# Patient Record
Sex: Female | Born: 1970 | Race: Black or African American | Hispanic: No | Marital: Single | Smoking: Former smoker
Health system: Southern US, Community
[De-identification: ages and names within clinical notes are randomized; demographics above are authoritative.]

## PROBLEM LIST (undated history)

## (undated) HISTORY — PX: TUBAL LIGATION: SHX77

---

## 1997-11-06 ENCOUNTER — Other Ambulatory Visit: Admission: RE | Admit: 1997-11-06 | Discharge: 1997-11-06 | Payer: Self-pay | Admitting: *Deleted

## 1998-01-13 ENCOUNTER — Inpatient Hospital Stay (HOSPITAL_COMMUNITY): Admission: AD | Admit: 1998-01-13 | Discharge: 1998-01-13 | Payer: Self-pay | Admitting: *Deleted

## 1998-01-16 ENCOUNTER — Ambulatory Visit (HOSPITAL_COMMUNITY): Admission: RE | Admit: 1998-01-16 | Discharge: 1998-01-16 | Payer: Self-pay | Admitting: *Deleted

## 1998-02-13 ENCOUNTER — Encounter: Payer: Self-pay | Admitting: *Deleted

## 1998-02-13 ENCOUNTER — Ambulatory Visit (HOSPITAL_COMMUNITY): Admission: RE | Admit: 1998-02-13 | Discharge: 1998-02-13 | Payer: Self-pay | Admitting: *Deleted

## 1998-04-20 ENCOUNTER — Inpatient Hospital Stay (HOSPITAL_COMMUNITY): Admission: AD | Admit: 1998-04-20 | Discharge: 1998-04-20 | Payer: Self-pay | Admitting: *Deleted

## 1998-05-25 ENCOUNTER — Inpatient Hospital Stay (HOSPITAL_COMMUNITY): Admission: AD | Admit: 1998-05-25 | Discharge: 1998-05-27 | Payer: Self-pay | Admitting: Obstetrics and Gynecology

## 2000-11-24 ENCOUNTER — Other Ambulatory Visit: Admission: RE | Admit: 2000-11-24 | Discharge: 2000-11-24 | Payer: Self-pay | Admitting: Obstetrics and Gynecology

## 2004-01-16 ENCOUNTER — Emergency Department (HOSPITAL_COMMUNITY): Admission: EM | Admit: 2004-01-16 | Discharge: 2004-01-16 | Payer: Self-pay | Admitting: Emergency Medicine

## 2006-08-13 ENCOUNTER — Emergency Department (HOSPITAL_COMMUNITY): Admission: EM | Admit: 2006-08-13 | Discharge: 2006-08-13 | Payer: Self-pay | Admitting: Family Medicine

## 2008-09-04 ENCOUNTER — Inpatient Hospital Stay (HOSPITAL_COMMUNITY): Admission: AD | Admit: 2008-09-04 | Discharge: 2008-09-04 | Payer: Self-pay | Admitting: Obstetrics & Gynecology

## 2008-09-04 ENCOUNTER — Ambulatory Visit: Payer: Self-pay | Admitting: Obstetrics and Gynecology

## 2008-12-31 ENCOUNTER — Emergency Department (HOSPITAL_COMMUNITY): Admission: EM | Admit: 2008-12-31 | Discharge: 2008-12-31 | Payer: Self-pay | Admitting: Emergency Medicine

## 2009-05-04 ENCOUNTER — Emergency Department (HOSPITAL_COMMUNITY): Admission: EM | Admit: 2009-05-04 | Discharge: 2009-05-04 | Payer: Self-pay | Admitting: Family Medicine

## 2009-05-07 ENCOUNTER — Emergency Department (HOSPITAL_COMMUNITY): Admission: EM | Admit: 2009-05-07 | Discharge: 2009-05-07 | Payer: Self-pay | Admitting: Emergency Medicine

## 2010-07-25 LAB — URINE MICROSCOPIC-ADD ON

## 2010-07-25 LAB — URINALYSIS, ROUTINE W REFLEX MICROSCOPIC
Bilirubin Urine: NEGATIVE
Glucose, UA: NEGATIVE mg/dL
Ketones, ur: NEGATIVE mg/dL
Nitrite: NEGATIVE
Protein, ur: NEGATIVE mg/dL
Specific Gravity, Urine: 1.02 (ref 1.005–1.030)
Urobilinogen, UA: 1 mg/dL (ref 0.0–1.0)
pH: 6.5 (ref 5.0–8.0)

## 2010-07-29 LAB — URINALYSIS, ROUTINE W REFLEX MICROSCOPIC
Bilirubin Urine: NEGATIVE
Glucose, UA: NEGATIVE mg/dL
Hgb urine dipstick: NEGATIVE
Ketones, ur: NEGATIVE mg/dL
Nitrite: NEGATIVE
Protein, ur: NEGATIVE mg/dL
Specific Gravity, Urine: 1.025 (ref 1.005–1.030)
Urobilinogen, UA: 0.2 mg/dL (ref 0.0–1.0)
pH: 6.5 (ref 5.0–8.0)

## 2010-07-29 LAB — GC/CHLAMYDIA PROBE AMP, GENITAL
Chlamydia, DNA Probe: NEGATIVE
GC Probe Amp, Genital: NEGATIVE

## 2010-07-29 LAB — WET PREP, GENITAL: Trich, Wet Prep: NONE SEEN

## 2010-10-21 ENCOUNTER — Emergency Department (HOSPITAL_COMMUNITY)
Admission: EM | Admit: 2010-10-21 | Discharge: 2010-10-21 | Disposition: A | Discharge status: Home / Self Care | Payer: Medicaid Other | Attending: Emergency Medicine | Admitting: Emergency Medicine

## 2010-10-21 DIAGNOSIS — R21 Rash and other nonspecific skin eruption: Secondary | ICD-10-CM | POA: Insufficient documentation

## 2011-01-27 ENCOUNTER — Emergency Department (HOSPITAL_COMMUNITY)
Admission: EM | Admit: 2011-01-27 | Discharge: 2011-01-27 | Disposition: A | Discharge status: Home / Self Care | Payer: Medicaid Other | Attending: Emergency Medicine | Admitting: Emergency Medicine

## 2011-01-27 DIAGNOSIS — N949 Unspecified condition associated with female genital organs and menstrual cycle: Secondary | ICD-10-CM | POA: Insufficient documentation

## 2011-01-27 DIAGNOSIS — R05 Cough: Secondary | ICD-10-CM | POA: Insufficient documentation

## 2011-01-27 DIAGNOSIS — R3915 Urgency of urination: Secondary | ICD-10-CM | POA: Insufficient documentation

## 2011-01-27 DIAGNOSIS — R350 Frequency of micturition: Secondary | ICD-10-CM | POA: Insufficient documentation

## 2011-01-27 DIAGNOSIS — R059 Cough, unspecified: Secondary | ICD-10-CM | POA: Insufficient documentation

## 2011-01-27 DIAGNOSIS — A5901 Trichomonal vulvovaginitis: Secondary | ICD-10-CM | POA: Insufficient documentation

## 2011-01-27 DIAGNOSIS — R10814 Left lower quadrant abdominal tenderness: Secondary | ICD-10-CM | POA: Insufficient documentation

## 2011-01-27 DIAGNOSIS — R300 Dysuria: Secondary | ICD-10-CM | POA: Insufficient documentation

## 2011-01-27 DIAGNOSIS — N899 Noninflammatory disorder of vagina, unspecified: Secondary | ICD-10-CM | POA: Insufficient documentation

## 2011-01-27 DIAGNOSIS — R110 Nausea: Secondary | ICD-10-CM | POA: Insufficient documentation

## 2011-01-27 DIAGNOSIS — E669 Obesity, unspecified: Secondary | ICD-10-CM | POA: Insufficient documentation

## 2011-01-27 DIAGNOSIS — J3489 Other specified disorders of nose and nasal sinuses: Secondary | ICD-10-CM | POA: Insufficient documentation

## 2011-01-27 LAB — URINALYSIS, ROUTINE W REFLEX MICROSCOPIC
Bilirubin Urine: NEGATIVE
Glucose, UA: NEGATIVE mg/dL
Protein, ur: NEGATIVE mg/dL
Urobilinogen, UA: 0.2 mg/dL (ref 0.0–1.0)

## 2011-01-27 LAB — URINE MICROSCOPIC-ADD ON

## 2011-01-27 LAB — WET PREP, GENITAL: Yeast Wet Prep HPF POC: NONE SEEN

## 2011-01-28 LAB — GC/CHLAMYDIA PROBE AMP, GENITAL: GC Probe Amp, Genital: NEGATIVE

## 2012-01-07 ENCOUNTER — Emergency Department (HOSPITAL_COMMUNITY)
Admission: EM | Admit: 2012-01-07 | Discharge: 2012-01-08 | Disposition: A | Discharge status: Home / Self Care | Payer: Self-pay | Attending: Emergency Medicine | Admitting: Emergency Medicine

## 2012-01-07 ENCOUNTER — Encounter (HOSPITAL_COMMUNITY): Payer: Self-pay | Admitting: Emergency Medicine

## 2012-01-07 DIAGNOSIS — L0201 Cutaneous abscess of face: Secondary | ICD-10-CM | POA: Insufficient documentation

## 2012-01-07 DIAGNOSIS — L0291 Cutaneous abscess, unspecified: Secondary | ICD-10-CM

## 2012-01-07 DIAGNOSIS — F17200 Nicotine dependence, unspecified, uncomplicated: Secondary | ICD-10-CM | POA: Insufficient documentation

## 2012-01-07 DIAGNOSIS — L03211 Cellulitis of face: Secondary | ICD-10-CM | POA: Insufficient documentation

## 2012-01-07 NOTE — ED Notes (Signed)
PT. REPORTS ABSCESS AT LEFT LOWER CHIN WITH DRAINAGE ONSET YESTERDAY .

## 2012-01-08 MED ORDER — CEPHALEXIN 500 MG PO CAPS: 500.0000 mg | ORAL_CAPSULE | Freq: Four times a day (QID) | ORAL | Status: DC

## 2012-01-08 MED ORDER — SULFAMETHOXAZOLE-TRIMETHOPRIM 800-160 MG PO TABS: 1.0000 | ORAL_TABLET | Freq: Two times a day (BID) | ORAL | Status: DC

## 2012-01-08 MED ORDER — SULFAMETHOXAZOLE-TMP DS 800-160 MG PO TABS
1.0000 | ORAL_TABLET | Freq: Once | ORAL | Status: AC
  Administered 2012-01-08: 1 via ORAL
  Filled 2012-01-08: qty 1

## 2012-01-08 MED ORDER — CEPHALEXIN 250 MG PO CAPS
500.0000 mg | ORAL_CAPSULE | Freq: Once | ORAL | Status: AC
  Administered 2012-01-08: 500 mg via ORAL
  Filled 2012-01-08: qty 1

## 2012-01-08 NOTE — ED Notes (Signed)
I&D was performed to swelling to left side of lower jaw. Patient tolerated the procedure well.

## 2012-01-08 NOTE — ED Notes (Signed)
Patient was given discharge instructions and released from the ED in stable condition.

## 2012-01-08 NOTE — ED Notes (Signed)
Patient with right lower jaw swelling and pain. States she works in a [employer] thinks she has an infection to face.

## 2012-01-08 NOTE — ED Provider Notes (Signed)
History     CSN: [id number]  Arrival date & time 01/07/12  2343   First MD Initiated Contact with Patient 01/08/12 0018      Chief Complaint  Patient presents with  . Abscess    (Consider location/radiation/quality/duration/timing/severity/associated sxs/prior treatment) HPI History provided by pt.   Pt c/o abscess of left chin x 2 days.  Severely painful, draining purulent fluid and associated w/ edema.  No pain in mouth.  Has two non-draining abscesses in groin also.  No prior history.  Denies fever and is otherwise feeling well.   History reviewed. No pertinent past medical history.  Past Surgical History  Procedure Date  . Tubal ligation     No family history on file.  History  Substance Use Topics  . Smoking status: Current Every Day Smoker  . Smokeless tobacco: Not on file  . Alcohol Use: Yes    OB History    Grav Para Term Preterm Abortions TAB SAB Ect Mult Living                  Review of Systems  All other systems reviewed and are negative.    Allergies  Review of patient's allergies indicates not on file.  Home Medications   Current Outpatient Rx  Name Route Sig Dispense Refill  . CEPHALEXIN 500 MG PO CAPS Oral Take 1 capsule (500 mg total) by mouth 4 (four) times daily. 28 capsule 0  . SULFAMETHOXAZOLE-TRIMETHOPRIM 800-160 MG PO TABS Oral Take 1 tablet by mouth 2 (two) times daily. 14 tablet 0    BP 128/78  Pulse 90  Temp 98.8 F (37.1 C) (Oral)  Resp 16  SpO2 98%  LMP 12/21/2011  Physical Exam  Nursing note and vitals reviewed. Constitutional: She is oriented to person, place, and time. She appears well-developed and well-nourished. No distress.  HENT:  Head: Normocephalic and atraumatic.       Nml mouth   Eyes:       Normal appearance  Neck: Normal range of motion.  Pulmonary/Chest: Effort normal.  Musculoskeletal: Normal range of motion.  Neurological: She is alert and oriented to person, place, and time.  Skin:       Scabbed  lesion on left anterior mandible w/ approx 2cm surrounding induration.  No drainage currently.  No surrounding erythema.  Ttp.  Two non-fluctuant, tender, skin-colored, 1-1.5cm raised lesions in left groin w/out surrounding cellulitis.    Psychiatric: She has a normal mood and affect. Her behavior is normal.    ED Course  Procedures (including critical care time) INCISION AND DRAINAGE Performed by: Otilio Miu Consent: Verbal consent obtained. Risks and benefits: risks, benefits and alternatives were discussed Type: abscess  Body area: left chin  Anesthesia: local infiltration  Local anesthetic: lidocaine 2% w/ epinephrine  Anesthetic total: 3 ml  Complexity: complex Blunt dissection to break up loculations  Drainage: purulent  Drainage amount: small  Packing material: 1/4 in iodoform gauze  Patient tolerance: Patient tolerated the procedure well with no immediate complications.     Labs Reviewed - No data to display No results found.   1. Abscess       MDM  Pt presents w/ draining abscess of left chin as well as 2 abscesses left groin.  No cellulitis.Abscess of chin I&D'd.  Did not I&D abscesses of groin because very small and non-fluctuant.   Pt has no prior h/o abscess nor any other medical problems.   Cbg 96 today.  Pt d/c'd home  w/ bactrim and keflex.  Return precautions discussed.         Otilio Miu, [place] 01/08/12 [phone]

## 2012-01-08 NOTE — ED Notes (Signed)
Gave pt ice pack.

## 2012-01-10 NOTE — ED Provider Notes (Signed)
Medical screening examination/treatment/procedure(s) were performed by non-physician practitioner and as supervising physician I was immediately available for consultation/collaboration.  Sylena Lotter, MD 01/10/12 0751 

## 2012-10-04 ENCOUNTER — Emergency Department (HOSPITAL_COMMUNITY)
Admission: EM | Admit: 2012-10-04 | Discharge: 2012-10-04 | Disposition: A | Discharge status: Home / Self Care | Payer: Medicaid Other | Attending: Emergency Medicine | Admitting: Emergency Medicine

## 2012-10-04 DIAGNOSIS — S0993XA Unspecified injury of face, initial encounter: Secondary | ICD-10-CM | POA: Insufficient documentation

## 2012-10-04 DIAGNOSIS — S0990XA Unspecified injury of head, initial encounter: Secondary | ICD-10-CM | POA: Insufficient documentation

## 2012-10-04 DIAGNOSIS — S199XXA Unspecified injury of neck, initial encounter: Secondary | ICD-10-CM | POA: Insufficient documentation

## 2012-10-04 DIAGNOSIS — F17200 Nicotine dependence, unspecified, uncomplicated: Secondary | ICD-10-CM | POA: Insufficient documentation

## 2012-10-04 DIAGNOSIS — Y92410 Unspecified street and highway as the place of occurrence of the external cause: Secondary | ICD-10-CM | POA: Insufficient documentation

## 2012-10-04 DIAGNOSIS — Y9389 Activity, other specified: Secondary | ICD-10-CM | POA: Insufficient documentation

## 2012-10-04 MED ORDER — METHOCARBAMOL 500 MG PO TABS: 500.0000 mg | ORAL_TABLET | Freq: Two times a day (BID) | ORAL | Status: DC

## 2012-10-04 MED ORDER — IBUPROFEN 800 MG PO TABS: 800.0000 mg | ORAL_TABLET | Freq: Three times a day (TID) | ORAL | Status: DC
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
## 2012-10-04 NOTE — ED Provider Notes (Signed)
  Medical screening examination/treatment/procedure(s) were performed by non-physician practitioner and as supervising physician I was immediately available for consultation/collaboration.    Maday Guarino, MD 10/04/12 2346 

## 2013-01-23 ENCOUNTER — Encounter (HOSPITAL_COMMUNITY): Payer: Self-pay | Admitting: *Deleted

## 2013-01-23 ENCOUNTER — Emergency Department (HOSPITAL_COMMUNITY)
Admission: EM | Admit: 2013-01-23 | Discharge: 2013-01-24 | Disposition: A | Discharge status: Home / Self Care | Payer: Medicaid Other | Attending: Emergency Medicine | Admitting: Emergency Medicine

## 2013-01-23 DIAGNOSIS — L02214 Cutaneous abscess of groin: Secondary | ICD-10-CM

## 2013-01-23 DIAGNOSIS — L02219 Cutaneous abscess of trunk, unspecified: Secondary | ICD-10-CM | POA: Insufficient documentation

## 2013-01-23 DIAGNOSIS — F17200 Nicotine dependence, unspecified, uncomplicated: Secondary | ICD-10-CM | POA: Insufficient documentation

## 2013-01-23 NOTE — ED Notes (Signed)
States has boil right groin; painful; no drainage

## 2013-01-23 NOTE — ED Notes (Signed)
Pt c/o white vaginal discharge; itching; foul odor

## 2013-01-24 MED ORDER — HYDROCODONE-ACETAMINOPHEN 5-325 MG PO TABS: 1.0000 | ORAL_TABLET | ORAL | Status: DC | PRN

## 2013-01-24 NOTE — ED Provider Notes (Signed)
Medical screening examination/treatment/procedure(s) were performed by non-physician practitioner and as supervising physician I was immediately available for consultation/collaboration.   Hanley Seamen, MD 01/24/13 2252

## 2013-01-24 NOTE — ED Provider Notes (Signed)
CSN: [id number]     Arrival date & time 01/23/13  2323 History   First MD Initiated Contact with Patient 01/24/13 0135     Chief Complaint  Patient presents with  . Abscess   (Consider location/radiation/quality/duration/timing/severity/associated sxs/prior Treatment) HPI History provided by pt.   Pt presents w/ abscess right groin.  Onset 2 days ago.  Severely painful.  Has applied warm compresses w/out drainage.  No associated fever or other skin changes.  Denies trauma.  Has had once abscess in the past.   History reviewed. No pertinent past medical history. Past Surgical History  Procedure Laterality Date  . Tubal ligation     No family history on file. History  Substance Use Topics  . Smoking status: Current Every Day Smoker  . Smokeless tobacco: Not on file  . Alcohol Use: Yes   OB History   Grav Para Term Preterm Abortions TAB SAB Ect Mult Living                 Review of Systems  All other systems reviewed and are negative.    Allergies  Review of patient's allergies indicates no known allergies.  Home Medications   Current Outpatient Rx  Name  Route  Sig  Dispense  Refill  . HYDROcodone-acetaminophen (NORCO/VICODIN) 5-325 MG per tablet   Oral   Take 1 tablet by mouth once.         Marland Kitchen HYDROcodone-acetaminophen (NORCO/VICODIN) 5-325 MG per tablet   Oral   Take 1 tablet by mouth every 4 (four) hours as needed for pain.   12 tablet   0    BP 117/86  Pulse 67  Temp(Src) 98.3 F (36.8 C) (Oral)  Resp 18  SpO2 98%  LMP 01/08/2013 Physical Exam  Nursing note and vitals reviewed. Constitutional: She is oriented to person, place, and time. She appears well-developed and well-nourished. No distress.  HENT:  Head: Normocephalic and atraumatic.  Eyes:  Normal appearance  Neck: Normal range of motion.  Pulmonary/Chest: Effort normal.  Musculoskeletal: Normal range of motion.  Neurological: She is alert and oriented to person, place, and time.  Skin:   2.5cm abscess right groin.  Fluctuant and severely ttp.  No surrounding cellulitis.   Psychiatric: She has a normal mood and affect. Her behavior is normal.    ED Course  Procedures (including critical care time) INCISION AND DRAINAGE Performed by: Ruby Cola E Consent: Verbal consent obtained. Risks and benefits: risks, benefits and alternatives were discussed Type: abscess  Body area: right groin  Anesthesia: local infiltration  Incision was made with a scalpel.  Local anesthetic: lidocaine 1% w/out epinephrine  Anesthetic total: 3 ml  Complexity: complex Blunt dissection to break up loculations  Drainage: purulent  Drainage amount: large  Packing material: none  Patient tolerance: Patient tolerated the procedure well with no immediate complications.    Labs Review  Labs Reviewed - No data to display Imaging Review No results found.  MDM   1. Abscess of right groin    Healthy 42yo F presents w/ abscess of R groin. No surrounding cellulitis. I&D'd w/ large amt drainage.  Pt d/c'd home w/ vicodin for pain.  Return precautions discussed.     Otilio Miu, PA-C 01/24/13 [phone]

## 2013-03-17 ENCOUNTER — Encounter (HOSPITAL_COMMUNITY): Payer: Self-pay | Admitting: Emergency Medicine

## 2013-03-17 ENCOUNTER — Emergency Department (HOSPITAL_COMMUNITY)
Admission: EM | Admit: 2013-03-17 | Discharge: 2013-03-17 | Disposition: A | Discharge status: Home / Self Care | Payer: No Typology Code available for payment source | Attending: Emergency Medicine | Admitting: Emergency Medicine

## 2013-03-17 DIAGNOSIS — M79605 Pain in left leg: Secondary | ICD-10-CM | POA: Diagnosis present

## 2013-03-17 DIAGNOSIS — M79609 Pain in unspecified limb: Secondary | ICD-10-CM | POA: Insufficient documentation

## 2013-03-17 DIAGNOSIS — F17200 Nicotine dependence, unspecified, uncomplicated: Secondary | ICD-10-CM | POA: Insufficient documentation

## 2013-03-17 MED ORDER — HYDROCODONE-ACETAMINOPHEN 5-325 MG PO TABS
1.0000 | ORAL_TABLET | Freq: Once | ORAL | Status: AC
  Administered 2013-03-17: 1 via ORAL
  Filled 2013-03-17: qty 1

## 2013-03-17 MED ORDER — HYDROCODONE-ACETAMINOPHEN 5-325 MG PO TABS: 1.0000 | ORAL_TABLET | Freq: Four times a day (QID) | ORAL | Status: DC | PRN

## 2013-03-17 NOTE — ED Provider Notes (Signed)
CSN: [id number]     Arrival date & time 03/17/13  0154 History   First MD Initiated Contact with Patient 03/17/13 0206     Chief Complaint  Patient presents with  . Leg Pain   (Consider location/radiation/quality/duration/timing/severity/associated sxs/prior Treatment) Patient is a 42 y.o. female presenting with leg pain. The history is provided by the patient.  Leg Pain Location:  Leg Time since incident:  3 months Injury: no   Leg location:  L leg Pain details:    Quality:  Aching   Radiates to: left foot.   Severity:  Moderate   Onset quality:  Gradual   Duration:  3 months   Timing:  Constant   Progression:  Unchanged Chronicity:  New Dislocation: no   Foreign body present:  No foreign bodies Prior injury to area:  No Relieved by:  NSAIDs (vicodin) Worsened by:  Bearing weight Ineffective treatments:  None tried Associated symptoms: no back pain, no decreased ROM, no fatigue, no fever, no neck pain, no stiffness, no swelling and no tingling     History reviewed. No pertinent past medical history. Past Surgical History  Procedure Laterality Date  . Tubal ligation     No family history on file. History  Substance Use Topics  . Smoking status: Current Every Day Smoker  . Smokeless tobacco: Not on file  . Alcohol Use: Yes   OB History   Grav Para Term Preterm Abortions TAB SAB Ect Mult Living                 Review of Systems  Constitutional: Negative for fever and fatigue.  HENT: Negative for congestion and drooling.   Eyes: Negative for pain.  Respiratory: Negative for cough and shortness of breath.   Cardiovascular: Negative for chest pain.  Gastrointestinal: Negative for nausea, vomiting, abdominal pain and diarrhea.  Genitourinary: Negative for dysuria and hematuria.  Musculoskeletal: Negative for back pain, gait problem, neck pain and stiffness.  Skin: Negative for color change.  Neurological: Negative for dizziness and headaches.  Hematological:  Negative for adenopathy.  Psychiatric/Behavioral: Negative for behavioral problems.  All other systems reviewed and are negative.    Allergies  Review of patient's allergies indicates no known allergies.  Home Medications   Current Outpatient Rx  Name  Route  Sig  Dispense  Refill  . HYDROcodone-acetaminophen (NORCO/VICODIN) 5-325 MG per tablet   Oral   Take 1 tablet by mouth once.         Marland Kitchen HYDROcodone-acetaminophen (NORCO/VICODIN) 5-325 MG per tablet   Oral   Take 1 tablet by mouth every 4 (four) hours as needed for pain.   12 tablet   0    BP 132/90  Pulse 85  Temp(Src) 97.8 F (36.6 C) (Oral)  Resp 18  SpO2 97%  LMP 03/17/2013 Physical Exam  Nursing note and vitals reviewed. Constitutional: She is oriented to person, place, and time. She appears well-developed and well-nourished.  HENT:  Head: Normocephalic.  Mouth/Throat: No oropharyngeal exudate.  Eyes: Conjunctivae and EOM are normal. Pupils are equal, round, and reactive to light.  Neck: Normal range of motion. Neck supple.  Cardiovascular: Normal rate, regular rhythm, normal heart sounds and intact distal pulses.  Exam reveals no gallop and no friction rub.   No murmur heard. Pulmonary/Chest: Effort normal and breath sounds normal. No respiratory distress. She has no wheezes.  Abdominal: Soft. Bowel sounds are normal. There is no tenderness. There is no rebound and no guarding.  Musculoskeletal: Normal  range of motion. She exhibits no edema and no tenderness.  Normal strength in the left lower extremity.  Normal range of motion in the left hip and left knee and left ankle.  2+ distal pulses in the lower extremities.  Sensation intact in the lower sternum these.  Mild tenderness to palpation of the left thigh diffusely.  Symmetric lower extremities.  Neurological: She is alert and oriented to person, place, and time.  Skin: Skin is warm and dry.  Psychiatric: She has a normal mood and affect. Her  behavior is normal.    ED Course  Procedures (including critical care time) Labs Review Labs Reviewed - No data to display Imaging Review No results found.  EKG Interpretation   None       MDM   1. Left leg pain    2:36 AM 42 y.o. female who presents with diffuse left leg pain for the last 3 months. The patient states that she is required to stand at her job throughout the day. She denies any injury. She has normal motor strength, sensation, and 2+ distal pulses in her lower extremities. She is afebrile and vital signs are unremarkable here. She is low risk per Wells criteria for DVT. I suspect her symptoms are likely associated with staining throughout the day. Will give her pain control here with Vicodin and recommend rest at home.  3:36 AM: Pt feeling better. Likely msk cause of her pain. Will rec pt establish w/ a pcp. I have discussed the diagnosis/risks/treatment options with the patient and believe the pt to be eligible for discharge home to follow-up with and establish w/ a pcp. We also discussed returning to the ED immediately if new or worsening sx occur. We discussed the sx which are most concerning (e.g., worsening pain) that necessitate immediate return. Any new prescriptions provided to the patient are listed below.  New Prescriptions   HYDROCODONE-ACETAMINOPHEN (NORCO) 5-325 MG PER TABLET    Take 1 tablet by mouth every 6 (six) hours as needed for moderate pain.       Junius Argyle, MD 03/17/13 [phone]

## 2013-03-17 NOTE — ED Notes (Signed)
Pt at work and left leg pain she has had since August got worse; pain starts in left foot and radiates to left hip; c/o shortness of breath x 1 month; c/o feeling dizzy

## 2013-04-06 ENCOUNTER — Emergency Department (HOSPITAL_COMMUNITY)
Admission: EM | Admit: 2013-04-06 | Discharge: 2013-04-07 | Disposition: A | Discharge status: Home / Self Care | Payer: Medicaid Other | Attending: Emergency Medicine | Admitting: Emergency Medicine

## 2013-04-06 ENCOUNTER — Encounter (HOSPITAL_COMMUNITY): Payer: Self-pay | Admitting: Emergency Medicine

## 2013-04-06 ENCOUNTER — Emergency Department (HOSPITAL_COMMUNITY): Payer: Medicaid Other

## 2013-04-06 DIAGNOSIS — F17200 Nicotine dependence, unspecified, uncomplicated: Secondary | ICD-10-CM | POA: Insufficient documentation

## 2013-04-06 DIAGNOSIS — R05 Cough: Secondary | ICD-10-CM | POA: Insufficient documentation

## 2013-04-06 DIAGNOSIS — B9689 Other specified bacterial agents as the cause of diseases classified elsewhere: Secondary | ICD-10-CM | POA: Insufficient documentation

## 2013-04-06 DIAGNOSIS — A599 Trichomoniasis, unspecified: Secondary | ICD-10-CM | POA: Insufficient documentation

## 2013-04-06 DIAGNOSIS — R059 Cough, unspecified: Secondary | ICD-10-CM | POA: Insufficient documentation

## 2013-04-06 DIAGNOSIS — A499 Bacterial infection, unspecified: Secondary | ICD-10-CM | POA: Insufficient documentation

## 2013-04-06 DIAGNOSIS — Z79899 Other long term (current) drug therapy: Secondary | ICD-10-CM | POA: Insufficient documentation

## 2013-04-06 DIAGNOSIS — Z3202 Encounter for pregnancy test, result negative: Secondary | ICD-10-CM | POA: Insufficient documentation

## 2013-04-06 DIAGNOSIS — J3489 Other specified disorders of nose and nasal sinuses: Secondary | ICD-10-CM | POA: Insufficient documentation

## 2013-04-06 DIAGNOSIS — N760 Acute vaginitis: Secondary | ICD-10-CM | POA: Insufficient documentation

## 2013-04-06 DIAGNOSIS — R0789 Other chest pain: Secondary | ICD-10-CM | POA: Insufficient documentation

## 2013-04-06 LAB — POCT PREGNANCY, URINE: Preg Test, Ur: NEGATIVE

## 2013-04-06 LAB — URINALYSIS, ROUTINE W REFLEX MICROSCOPIC
Glucose, UA: NEGATIVE mg/dL
Hgb urine dipstick: NEGATIVE
Leukocytes, UA: NEGATIVE
pH: 7.5 (ref 5.0–8.0)

## 2013-04-06 NOTE — ED Notes (Addendum)
Presents with cough for the last 2 weeks, with yellow sputum, bilateral breath sounds clear. Also c/o lower abdominal pain for 2-3 days with white thick vaginal discharge. Pain is described as cramping. LMP: Sunday, normal per patient. Has tubal ligation.

## 2013-04-07 LAB — CBC
HCT: 35.7 % — ABNORMAL LOW (ref 36.0–46.0)
Hemoglobin: 11.9 g/dL — ABNORMAL LOW (ref 12.0–15.0)
MCH: 28.2 pg (ref 26.0–34.0)
MCHC: 33.3 g/dL (ref 30.0–36.0)
MCV: 84.6 fL (ref 78.0–100.0)
Platelets: 235 10*3/uL (ref 150–400)
RBC: 4.22 MIL/uL (ref 3.87–5.11)
RDW: 13.4 % (ref 11.5–15.5)
WBC: 5.7 10*3/uL (ref 4.0–10.5)

## 2013-04-07 LAB — COMPREHENSIVE METABOLIC PANEL
ALT: 14 U/L (ref 0–35)
AST: 20 U/L (ref 0–37)
CO2: 27 mEq/L (ref 19–32)
Calcium: 8.6 mg/dL (ref 8.4–10.5)
Creatinine, Ser: 0.78 mg/dL (ref 0.50–1.10)
GFR calc Af Amer: >90 mL/min (ref >90)
Glucose, Bld: 117 mg/dL — ABNORMAL HIGH (ref 70–99)
Potassium: 3.8 mEq/L (ref 3.5–5.1)
Sodium: 139 mEq/L (ref 135–145)
Total Protein: 7.3 g/dL (ref 6.0–8.3)

## 2013-04-07 LAB — WET PREP, GENITAL

## 2013-04-07 MED ORDER — METRONIDAZOLE 500 MG PO TABS
2000.0000 mg | ORAL_TABLET | Freq: Once | ORAL | Status: AC
  Administered 2013-04-07: 2000 mg via ORAL
  Filled 2013-04-07: qty 4

## 2013-04-07 MED ORDER — ONDANSETRON 4 MG PO TBDP
8.0000 mg | ORAL_TABLET | Freq: Once | ORAL | Status: AC
  Administered 2013-04-07: 8 mg via ORAL
  Filled 2013-04-07: qty 2

## 2013-04-07 NOTE — ED Provider Notes (Signed)
Medical screening examination/treatment/procedure(s) were performed by non-physician practitioner and as supervising physician I was immediately available for consultation/collaboration.  Malaiyah Achorn M Yarianna Varble, MD 04/07/13 2047 

## 2013-04-07 NOTE — ED Provider Notes (Signed)
CSN: [id number]     Arrival date & time 04/06/13  2027 History   First MD Initiated Contact with Patient 04/07/13 0017     No chief complaint on file.  (Consider location/radiation/quality/duration/timing/severity/associated sxs/prior Treatment) The history is provided by the patient and medical records. No language interpreter was used.    Tricia Hughes is a 42 y.o. female  with a hx of tubal ligation presents to the Emergency Department complaining of gradual, persistent, progressively worsening cough with associated nasal congestion onset 1 week ago.  Pt felt that she was "coming down with something."  Pt was taking Dayquil and Nyquil and tussin for the cough. She also reports chest pain only with coughing.  Pt reports that this was helping significantly.  Pt denies fever, chills, headaches, neck pain, SOB, sore throat.    Pt also c/o vaginal discharge beginning 1 week ago as well.  She describes her discharge as thick, white, and with significant itching.  Pt also endorses mild lower abdominal pain, more pelvic in nature.  Pt reports using a douche about the time that her symptoms started.  LMP: 04/02/13    History reviewed. No pertinent past medical history. Past Surgical History  Procedure Laterality Date  . Tubal ligation     History reviewed. No pertinent family history. History  Substance Use Topics  . Smoking status: Current Every Day Smoker  . Smokeless tobacco: Not on file  . Alcohol Use: Yes   OB History   Grav Para Term Preterm Abortions TAB SAB Ect Mult Living                 Review of Systems  Constitutional: Negative for fever, chills, appetite change and fatigue.  HENT: Positive for congestion, postnasal drip, rhinorrhea and sinus pressure. Negative for ear discharge, ear pain, mouth sores and sore throat.   Eyes: Negative for visual disturbance.  Respiratory: Positive for cough and chest tightness. Negative for shortness of breath, wheezing and stridor.    Cardiovascular: Negative for chest pain, palpitations and leg swelling.  Gastrointestinal: Negative for nausea, vomiting, abdominal pain and diarrhea.  Genitourinary: Positive for vaginal discharge and pelvic pain. Negative for dysuria, urgency, frequency and hematuria.  Musculoskeletal: Negative for arthralgias, back pain, myalgias and neck stiffness.  Skin: Negative for rash.  Neurological: Negative for syncope, light-headedness, numbness and headaches.  Hematological: Negative for adenopathy.  Psychiatric/Behavioral: The patient is not nervous/anxious.   All other systems reviewed and are negative.    Allergies  Review of patient's allergies indicates no known allergies.  Home Medications   Current Outpatient Rx  Name  Route  Sig  Dispense  Refill  . guaifenesin (ROBITUSSIN) 100 MG/5ML syrup   Oral   Take 200 mg by mouth 3 (three) times daily as needed for cough or congestion.         Marland Kitchen ibuprofen (ADVIL,MOTRIN) 200 MG tablet   Oral   Take 600 mg by mouth every 6 (six) hours as needed for moderate pain.          Marland Kitchen loratadine (CLARITIN) 10 MG tablet   Oral   Take 10 mg by mouth daily.          BP 123/73  Pulse 57  Temp(Src) 98.2 F (36.8 C)  Resp 16  Wt 200 lb (90.719 kg)  SpO2 99%  LMP 04/02/2013 Physical Exam  Nursing note and vitals reviewed. Constitutional: She is oriented to person, place, and time. She appears well-developed and well-nourished. No distress.  HENT:  Head: Normocephalic and atraumatic.  Right Ear: Tympanic membrane, external ear and ear canal normal.  Left Ear: Tympanic membrane, external ear and ear canal normal.  Nose: Mucosal edema and rhinorrhea present. No epistaxis. Right sinus exhibits no maxillary sinus tenderness and no frontal sinus tenderness. Left sinus exhibits no maxillary sinus tenderness and no frontal sinus tenderness.  Mouth/Throat: Uvula is midline, oropharynx is clear and moist and mucous membranes are normal. Mucous  membranes are not pale and not cyanotic. No uvula swelling. No oropharyngeal exudate, posterior oropharyngeal edema, posterior oropharyngeal erythema or tonsillar abscesses.  Eyes: Conjunctivae are normal. Pupils are equal, round, and reactive to light. No scleral icterus.  Neck: Normal range of motion and full passive range of motion without pain. Neck supple.  Cardiovascular: Normal rate, regular rhythm, normal heart sounds and intact distal pulses.   No murmur heard. Pulmonary/Chest: Effort normal and breath sounds normal. No stridor. No respiratory distress. She has no wheezes.  Abdominal: Soft. Bowel sounds are normal. She exhibits no distension and no mass. There is no hepatosplenomegaly. There is no tenderness. There is no rebound and no CVA tenderness. Hernia confirmed negative in the right inguinal area and confirmed negative in the left inguinal area.  Genitourinary: Uterus normal. Pelvic exam was performed with patient supine. There is no rash, tenderness, lesion or injury on the right labia. There is no rash, tenderness, lesion or injury on the left labia. Uterus is not deviated, not enlarged, not fixed and not tender. Cervix exhibits no motion tenderness, no discharge and no friability. Right adnexum displays no mass, no tenderness and no fullness. Left adnexum displays no mass, no tenderness and no fullness. No erythema, tenderness or bleeding around the vagina. No foreign body around the vagina. No signs of injury around the vagina. Vaginal discharge (Thin, frothy, green) found.  Musculoskeletal: Normal range of motion. She exhibits no edema.  Lymphadenopathy:    She has no cervical adenopathy.       Right: No inguinal adenopathy present.       Left: No inguinal adenopathy present.  Neurological: She is alert and oriented to person, place, and time. She exhibits normal muscle tone. Coordination normal.  Speech is clear and goal oriented Moves extremities without ataxia  Skin: Skin is  warm and dry. No rash noted. She is not diaphoretic. No erythema.  Psychiatric: She has a normal mood and affect. Her behavior is normal.    ED Course  Procedures (including critical care time) Labs Review Labs Reviewed  WET PREP, GENITAL - Abnormal; Notable for the following:    Trich, Wet Prep MODERATE (*)    Clue Cells Wet Prep HPF POC MODERATE (*)    WBC, Wet Prep HPF POC FEW (*)    All other components within normal limits  CBC - Abnormal; Notable for the following:    Hemoglobin 11.9 (*)    HCT 35.7 (*)    All other components within normal limits  COMPREHENSIVE METABOLIC PANEL - Abnormal; Notable for the following:    Glucose, Bld 117 (*)    Total Bilirubin 0.2 (*)    All other components within normal limits  GC/CHLAMYDIA PROBE AMP  URINALYSIS, ROUTINE W REFLEX MICROSCOPIC  POCT PREGNANCY, URINE   Imaging Review Dg Chest 2 View  04/06/2013   CLINICAL DATA:  Shortness of breath and cough.  EXAM: CHEST  2 VIEW  COMPARISON:  None.  FINDINGS: The heart size and mediastinal contours are within normal limits. Both lungs  are clear. The visualized skeletal structures are unremarkable.  IMPRESSION: Normal exam.   Electronically Signed   By: Geanie Cooley M.D.   On: 04/06/2013 21:47    EKG Interpretation   None       MDM   1. Trichomonas   2. BV (bacterial vaginosis)      Somya Scotland Dost presents with URI symptoms and vaginal discharge after douching.  Patient with clear and equal lung sounds.  Abdomen soft and nontender  1:04 AM UA without evidence of urinary tract infection, pregnancy test negative.  Patient pelvic exam clinically consistent with Trichomonas.  Will await wet prep.  Patient without cervical motion tenderness or adnexal masses or tenderness.  CBC without leukocytosis. CMP and wet prep pending.    Pt CXR negative for acute infiltrate. Patients symptoms are consistent with URI, likely viral etiology. Discussed that antibiotics are not indicated for  viral infections. Pt will be discharged with symptomatic treatment.  Verbalizes understanding and is agreeable with plan.   I personally reviewed the imaging tests through PACS system  I reviewed available ER/hospitalization records through the EMR  1:49 AM Patient with trichomonas is suspected. Patient also with BV. Will treat here with metronidazole 2 g by mouth.  Patient to be discharged with instructions to follow up with OBGYN. Discussed importance of using protection when sexually active. Pt understands that they have GC/Chlamydia cultures pending and that they will need to inform all sexual partners if results return positive. Pt not concerning for PID because hemodynamically stable and no cervical motion tenderness on pelvic exam.   It has been determined that no acute conditions requiring further emergency intervention are present at this time. The patient/guardian have been advised of the diagnosis and plan. We have discussed signs and symptoms that warrant return to the ED, such as changes or worsening in symptoms.   Vital signs are stable at discharge.   BP 123/73  Pulse 57  Temp(Src) 98.2 F (36.8 C)  Resp 16  Wt 200 lb (90.719 kg)  SpO2 99%  LMP 04/02/2013  Patient/guardian has voiced understanding and agreed to follow-up with the PCP or specialist.      Dierdre Forth, PA-C 04/07/13 0150

## 2013-10-22 ENCOUNTER — Encounter (HOSPITAL_COMMUNITY): Payer: Self-pay | Admitting: Emergency Medicine

## 2013-10-22 ENCOUNTER — Emergency Department (HOSPITAL_COMMUNITY)
Admission: EM | Admit: 2013-10-22 | Discharge: 2013-10-22 | Disposition: A | Discharge status: Home / Self Care | Payer: Medicaid Other | Attending: Emergency Medicine | Admitting: Emergency Medicine

## 2013-10-22 DIAGNOSIS — M545 Low back pain, unspecified: Secondary | ICD-10-CM | POA: Insufficient documentation

## 2013-10-22 DIAGNOSIS — Z791 Long term (current) use of non-steroidal anti-inflammatories (NSAID): Secondary | ICD-10-CM | POA: Insufficient documentation

## 2013-10-22 DIAGNOSIS — F17200 Nicotine dependence, unspecified, uncomplicated: Secondary | ICD-10-CM | POA: Insufficient documentation

## 2013-10-22 LAB — URINALYSIS, ROUTINE W REFLEX MICROSCOPIC
Bilirubin Urine: NEGATIVE
GLUCOSE, UA: NEGATIVE mg/dL
Hgb urine dipstick: NEGATIVE
KETONES UR: NEGATIVE mg/dL
Leukocytes, UA: NEGATIVE
Nitrite: NEGATIVE
PROTEIN: NEGATIVE mg/dL
Specific Gravity, Urine: 1.018 (ref 1.005–1.030)
UROBILINOGEN UA: 0.2 mg/dL (ref 0.0–1.0)
pH: 6.5 (ref 5.0–8.0)

## 2013-10-22 MED ORDER — NAPROXEN 500 MG PO TABS
500.0000 mg | ORAL_TABLET | Freq: Two times a day (BID) | ORAL | Status: DC
Start: 2013-10-22 — End: 2014-01-27

## 2013-10-22 MED ORDER — METHOCARBAMOL 500 MG PO TABS: 500.0000 mg | ORAL_TABLET | Freq: Two times a day (BID) | ORAL | Status: DC

## 2013-10-22 MED ORDER — METHOCARBAMOL 1000 MG/10ML IJ SOLN
500.0000 mg | Freq: Once | INTRAMUSCULAR | Status: DC
  Filled 2013-10-22: qty 5

## 2013-10-22 MED ORDER — OXYCODONE-ACETAMINOPHEN 5-325 MG PO TABS: 1.0000 | ORAL_TABLET | ORAL | Status: DC | PRN

## 2013-10-22 MED ORDER — OXYCODONE-ACETAMINOPHEN 5-325 MG PO TABS
1.0000 | ORAL_TABLET | Freq: Once | ORAL | Status: AC
  Administered 2013-10-22: 1 via ORAL
  Filled 2013-10-22: qty 1

## 2013-10-22 MED ORDER — METHOCARBAMOL 500 MG PO TABS
500.0000 mg | ORAL_TABLET | Freq: Once | ORAL | Status: AC
  Administered 2013-10-22: 500 mg via ORAL
  Filled 2013-10-22: qty 1

## 2013-10-22 NOTE — Discharge Instructions (Signed)
Take naprosyn twice daily with food  Take percocet for severe pain - Please be careful with this medication.  It can cause drowsiness.  Use caution while driving, operating machinery, drinking alcohol, or any other activities that may impair your physical or mental abilities.   Take robaxin for muscle spasm at night - Please be careful with this medication.  It can cause drowsiness.  Use caution while driving, operating machinery, drinking alcohol, or any other activities that may impair your physical or mental abilities.   Return to the emergency department if you develop any changing/worsening condition, loss of bowel/bladder function, weakness, loss of sensation, fever, or any other concerns (please read additional information regarding your condition below)    Back Pain, Adult Low back pain is very common. About 1 in 5 people have back pain.The cause of low back pain is rarely dangerous. The pain often gets better over time.About half of people with a sudden onset of back pain feel better in just 2 weeks. About 8 in 10 people feel better by 6 weeks.  CAUSES Some common causes of back pain include:  Strain of the muscles or ligaments supporting the spine.  Wear and tear (degeneration) of the spinal discs.  Arthritis.  Direct injury to the back. DIAGNOSIS Most of the time, the direct cause of low back pain is not known.However, back pain can be treated effectively even when the exact cause of the pain is unknown.Answering your caregiver's questions about your overall health and symptoms is one of the most accurate ways to make sure the cause of your pain is not dangerous. If your caregiver needs more information, he or she may order lab work or imaging tests (X-rays or MRIs).However, even if imaging tests show changes in your back, this usually does not require surgery. HOME CARE INSTRUCTIONS For many people, back pain returns.Since low back pain is rarely dangerous, it is often a  condition that people can learn to [facility]fmanageon their own.   Remain active. It is stressful on the back to sit or stand in one place. Do not sit, drive, or stand in one place for more than 30 minutes at a time. Take short walks on level surfaces as soon as pain allows.Try to increase the length of time you walk each day.  Do not stay in bed.Resting more than 1 or 2 days can delay your recovery.  Do not avoid exercise or work.Your body is made to move.It is not dangerous to be active, even though your back may hurt.Your back will likely heal faster if you return to being active before your pain is gone.  Pay attention to your body when you bend and lift. Many people have less discomfortwhen lifting if they bend their knees, keep the load close to their bodies,and avoid twisting. Often, the most comfortable positions are those that put less stress on your recovering back.  Find a comfortable position to sleep. Use a firm mattress and lie on your side with your knees slightly bent. If you lie on your back, put a pillow under your knees.  Only take over-the-counter or prescription medicines as directed by your caregiver. Over-the-counter medicines to reduce pain and inflammation are often the most helpful.Your caregiver may prescribe muscle relaxant drugs.These medicines help dull your pain so you can more quickly return to your normal activities and healthy exercise.  Put ice on the injured area.  Put ice in a plastic bag.  Place a towel between your skin and the  bag.  Leave the ice on for 15-20 minutes, 03-04 times a day for the first 2 to 3 days. After that, ice and heat may be alternated to reduce pain and spasms.  Ask your caregiver about trying back exercises and gentle massage. This may be of some benefit.  Avoid feeling anxious or stressed.Stress increases muscle tension and can worsen back pain.It is important to recognize when you are anxious or stressed and learn ways to  manage it.Exercise is a great option. SEEK MEDICAL CARE IF:  You have pain that is not relieved with rest or medicine.  You have pain that does not improve in 1 week.  You have new symptoms.  You are generally not feeling well. SEEK IMMEDIATE MEDICAL CARE IF:   You have pain that radiates from your back into your legs.  You develop new bowel or bladder control problems.  You have unusual weakness or numbness in your arms or legs.  You develop nausea or vomiting.  You develop abdominal pain.  You feel faint. Document Released: 04/06/2005 Document Revised: 10/06/2011 Document Reviewed: 08/25/2010 [facility] Patient Information 2015 [place], [place]. This information is not intended to replace advice given to you by your health care provider. Make sure you discuss any questions you have with your health care provider.

## 2013-10-22 NOTE — ED Notes (Signed)
Pt. Stated, I reached over to my daughter and my right side of my back just started hurting.  Its the rt side of my back.

## 2013-10-22 NOTE — ED Provider Notes (Signed)
CSN: [id number]     Arrival date & time 10/22/13  1319 History   This chart was scribed for non-physician practitioner Santiago GladHeather Trachelle Low, PA-C, working with Gwyneth SproutWhitney Plunkett, MD, by Yevette EdwardsAngela Bracken, ED Scribe. This patient was seen in room TR06C/TR06C and the patient's care was started at 3:57 PM. First MD Initiated Contact with Patient 10/22/13 1428     Chief Complaint  Patient presents with  . Back Pain    The history is provided by the patient. No language interpreter was used.   HPI Comments: Tricia Elenor LegatoDiane Croghan is a 43 y.o. female who presents to the Emergency Department complaining of right-sided back pain which began yesterday evening after she reached down to pick up a tray and experienced immediate pain. The pain does not radiate. She rates the pain as 10/10, and she reports the pain is increased with movement and ambulation. She denies urinary/bowel incontinence, numbness, paresthesia, or fever. Denies dysuria, hematuria, increased urinary frequency, or urgency.  She did not treat her pain  PTA.   History reviewed. No pertinent past medical history. Past Surgical History  Procedure Laterality Date  . Tubal ligation     No family history on file. History  Substance Use Topics  . Smoking status: Current Every Day Smoker  . Smokeless tobacco: Not on file  . Alcohol Use: Yes   No OB history provided.  Review of Systems  Constitutional: Negative for fever.  Genitourinary: Negative for urgency.  Musculoskeletal: Positive for back pain.  Neurological: Negative for weakness and numbness.  All other systems reviewed and are negative.   Allergies  Review of patient's allergies indicates no known allergies.  Home Medications   Prior to Admission medications   Medication Sig Start Date End Date Taking? Authorizing Provider  ibuprofen (ADVIL,MOTRIN) 200 MG tablet Take 400 mg by mouth every 6 (six) hours as needed for fever, headache or mild pain.   Yes Historical Provider, MD    Triage Vitals: BP 123/74  Pulse 76  Temp(Src) 98.1 F (36.7 C) (Oral)  Resp 20  SpO2 99%  LMP 10/14/2013  Physical Exam  Nursing note and vitals reviewed. Constitutional: She is oriented to person, place, and time. She appears well-developed and well-nourished. No distress.  HENT:  Head: Normocephalic and atraumatic.  Eyes: Conjunctivae and EOM are normal.  Neck: Neck supple. No tracheal deviation present.  Cardiovascular: Normal rate.   Pulmonary/Chest: Effort normal. No respiratory distress.  Musculoskeletal: Normal range of motion. She exhibits tenderness.  Tenderness to palpation of the right lower back.  No tenderness to palpation over lumbar or thoracic.  No step-offs or bony deformities.   Neurological: She is alert and oriented to person, place, and time.  Muscle strength 5/5 lower extremities. Distal sensation of lower extremities is intact.   Skin: Skin is warm and dry.  Psychiatric: She has a normal mood and affect. Her behavior is normal.    ED Course  Procedures (including critical care time)  DIAGNOSTIC STUDIES: Oxygen Saturation is 99% on room air, normal by my interpretation.    COORDINATION OF CARE:  4:02 PM- Discussed treatment plan with patient, and the patient agreed to the plan.  The plan includes use of NSAIDs, muscle relaxants, and alternation between ice and heat. Will also prescribe muscle relaxant IM. Patient requesting to have a UA checked.  She is concerned that this pain may be related to her kidneys.  Patient explained that this pain is not typical pain from a kidney stone or infection,  but she states that she would feel much better if a UA is checked.  UA ordered.  5:00 PM Patient signed out to Land O'LakesKaitlyn Palmer, PA-C.  UA pending.    Labs Review Labs Reviewed  URINALYSIS, ROUTINE W REFLEX MICROSCOPIC    Imaging Review No results found.   EKG Interpretation None      MDM   Final diagnoses:  None   Patient presenting with  back pain.  History and physical most consistent with muscle strain.   No neurological deficits and normal neuro exam.  Patient can walk but states is painful.  No loss of bowel or bladder control.  No concern for cauda equina.  No fever, night sweats, weight loss, h/o cancer, IVDU.  RICE protocol and pain medicine indicated and discussed with patient.  Patient insistent on having a UA checked.  UA ordered.  Inda CokeKaitlyn Palmer, PA-C will follow up on results.     Santiago GladHeather Sai Zinn, PA-C 10/23/13 1106

## 2013-10-23 NOTE — ED Provider Notes (Signed)
Medical screening examination/treatment/procedure(s) were performed by non-physician practitioner and as supervising physician I was immediately available for consultation/collaboration.   EKG Interpretation None        Gwyneth SproutWhitney Destan Franchini, MD 10/23/13 1611

## 2014-01-27 ENCOUNTER — Emergency Department (HOSPITAL_COMMUNITY)
Admission: EM | Admit: 2014-01-27 | Discharge: 2014-01-27 | Disposition: A | Discharge status: Home / Self Care | Payer: Medicaid Other | Attending: Emergency Medicine | Admitting: Emergency Medicine

## 2014-01-27 ENCOUNTER — Encounter (HOSPITAL_COMMUNITY): Payer: Self-pay | Admitting: Emergency Medicine

## 2014-01-27 DIAGNOSIS — R1030 Lower abdominal pain, unspecified: Secondary | ICD-10-CM

## 2014-01-27 DIAGNOSIS — Z3202 Encounter for pregnancy test, result negative: Secondary | ICD-10-CM | POA: Insufficient documentation

## 2014-01-27 DIAGNOSIS — Z9851 Tubal ligation status: Secondary | ICD-10-CM | POA: Insufficient documentation

## 2014-01-27 DIAGNOSIS — N760 Acute vaginitis: Secondary | ICD-10-CM | POA: Insufficient documentation

## 2014-01-27 DIAGNOSIS — Z79899 Other long term (current) drug therapy: Secondary | ICD-10-CM | POA: Insufficient documentation

## 2014-01-27 DIAGNOSIS — R110 Nausea: Secondary | ICD-10-CM | POA: Insufficient documentation

## 2014-01-27 DIAGNOSIS — B9689 Other specified bacterial agents as the cause of diseases classified elsewhere: Secondary | ICD-10-CM

## 2014-01-27 DIAGNOSIS — Z720 Tobacco use: Secondary | ICD-10-CM | POA: Insufficient documentation

## 2014-01-27 LAB — CBC WITH DIFFERENTIAL/PLATELET
BASOS ABS: 0 10*3/uL (ref 0.0–0.1)
BASOS PCT: 0 % (ref 0–1)
EOS ABS: 0.1 10*3/uL (ref 0.0–0.7)
Eosinophils Relative: 1 % (ref 0–5)
HCT: 35.2 % — ABNORMAL LOW (ref 36.0–46.0)
HEMOGLOBIN: 11.6 g/dL — AB (ref 12.0–15.0)
Lymphocytes Relative: 23 % (ref 12–46)
Lymphs Abs: 1.5 10*3/uL (ref 0.7–4.0)
MCH: 27.7 pg (ref 26.0–34.0)
MCHC: 33 g/dL (ref 30.0–36.0)
MCV: 84 fL (ref 78.0–100.0)
MONO ABS: 0.9 10*3/uL (ref 0.1–1.0)
Monocytes Relative: 13 % — ABNORMAL HIGH (ref 3–12)
Neutro Abs: 4 10*3/uL (ref 1.7–7.7)
Neutrophils Relative %: 63 % (ref 43–77)
Platelets: 217 10*3/uL (ref 150–400)
RBC: 4.19 MIL/uL (ref 3.87–5.11)
RDW: 14 % (ref 11.5–15.5)
WBC: 6.4 10*3/uL (ref 4.0–10.5)

## 2014-01-27 LAB — URINALYSIS, ROUTINE W REFLEX MICROSCOPIC
BILIRUBIN URINE: NEGATIVE
Glucose, UA: NEGATIVE mg/dL
Hgb urine dipstick: NEGATIVE
KETONES UR: NEGATIVE mg/dL
NITRITE: NEGATIVE
PH: 6 (ref 5.0–8.0)
PROTEIN: NEGATIVE mg/dL
Specific Gravity, Urine: 1.02 (ref 1.005–1.030)
Urobilinogen, UA: 1 mg/dL (ref 0.0–1.0)

## 2014-01-27 LAB — URINE MICROSCOPIC-ADD ON

## 2014-01-27 LAB — COMPREHENSIVE METABOLIC PANEL
ALBUMIN: 3.5 g/dL (ref 3.5–5.2)
ALT: 15 U/L (ref 0–35)
ANION GAP: 11 (ref 5–15)
AST: 14 U/L (ref 0–37)
Alkaline Phosphatase: 67 U/L (ref 39–117)
BUN: 12 mg/dL (ref 6–23)
CALCIUM: 9.3 mg/dL (ref 8.4–10.5)
CO2: 23 mEq/L (ref 19–32)
CREATININE: 0.79 mg/dL (ref 0.50–1.10)
Chloride: 107 mEq/L (ref 96–112)
GFR calc Af Amer: >90 mL/min (ref >90)
GFR calc non Af Amer: >90 mL/min (ref >90)
Glucose, Bld: 93 mg/dL (ref 70–99)
Potassium: 4.3 mEq/L (ref 3.7–5.3)
Sodium: 141 mEq/L (ref 137–147)
TOTAL PROTEIN: 7.5 g/dL (ref 6.0–8.3)
Total Bilirubin: <0.2 mg/dL — ABNORMAL LOW (ref 0.3–1.2)

## 2014-01-27 LAB — PREGNANCY, URINE: Preg Test, Ur: NEGATIVE

## 2014-01-27 LAB — WET PREP, GENITAL
TRICH WET PREP: NONE SEEN
WBC WET PREP: NONE SEEN
Yeast Wet Prep HPF POC: NONE SEEN

## 2014-01-27 MED ORDER — METRONIDAZOLE 500 MG PO TABS: 500.0000 mg | ORAL_TABLET | Freq: Two times a day (BID) | ORAL | Status: DC

## 2014-01-27 MED ORDER — OXYCODONE-ACETAMINOPHEN 5-325 MG PO TABS
2.0000 | ORAL_TABLET | Freq: Once | ORAL | Status: AC
  Administered 2014-01-27: 2 via ORAL
  Filled 2014-01-27: qty 2

## 2014-01-27 MED ORDER — TRAMADOL HCL 50 MG PO TABS: 50.0000 mg | ORAL_TABLET | Freq: Four times a day (QID) | ORAL | Status: DC | PRN

## 2014-01-27 NOTE — ED Notes (Addendum)
Pt reports nausea, 2 episodes diarrhea, and "itching" with urination, and vaginal discharge for 3 days.

## 2014-01-27 NOTE — Discharge Instructions (Signed)
Take flagyl as prescribed for the next week. Avoid any alcohol with this medication. Take tramadol as directed for pain.  Bacterial Vaginosis Bacterial vaginosis is a vaginal infection that occurs when the normal balance of bacteria in the vagina is disrupted. It results from an overgrowth of certain bacteria. This is the most common vaginal infection in women of childbearing age. Treatment is important to prevent complications, especially in pregnant women, as it can cause a premature delivery. CAUSES  Bacterial vaginosis is caused by an increase in harmful bacteria that are normally present in smaller amounts in the vagina. Several different kinds of bacteria can cause bacterial vaginosis. However, the reason that the condition develops is not fully understood. RISK FACTORS Certain activities or behaviors can put you at an increased risk of developing bacterial vaginosis, including:  Having a new sex partner or multiple sex partners.  Douching.  Using an intrauterine device (IUD) for contraception. Women do not get bacterial vaginosis from toilet seats, bedding, swimming pools, or contact with objects around them. SIGNS AND SYMPTOMS  Some women with bacterial vaginosis have no signs or symptoms. Common symptoms include:  Grey vaginal discharge.  A fishlike odor with discharge, especially after sexual intercourse.  Itching or burning of the vagina and vulva.  Burning or pain with urination. DIAGNOSIS  Your health care provider will take a medical history and examine the vagina for signs of bacterial vaginosis. A sample of vaginal fluid may be taken. Your health care provider will look at this sample under a microscope to check for bacteria and abnormal cells. A vaginal pH test may also be done.  TREATMENT  Bacterial vaginosis may be treated with antibiotic medicines. These may be given in the form of a pill or a vaginal cream. A second round of antibiotics may be prescribed if the  condition comes back after treatment.  HOME CARE INSTRUCTIONS   Only take over-the-counter or prescription medicines as directed by your health care provider.  If antibiotic medicine was prescribed, take it as directed. Make sure you finish it even if you start to feel better.  Do not have sex until treatment is completed.  Tell all sexual partners that you have a vaginal infection. They should see their health care provider and be treated if they have problems, such as a mild rash or itching.  Practice safe sex by using condoms and only having one sex partner. SEEK MEDICAL CARE IF:   Your symptoms are not improving after 3 days of treatment.  You have increased discharge or pain.  You have a fever. MAKE SURE YOU:   Understand these instructions.  Will watch your condition.  Will get help right away if you are not doing well or get worse. FOR MORE INFORMATION  Centers for Disease Control and Prevention, Division of STD Prevention: SolutionApps.co.zawww.cdc.gov/std American Sexual Health Association (ASHA): www.ashastd.org  Document Released: 04/06/2005 Document Revised: 01/25/2013 Document Reviewed: 11/16/2012 [facility] Patient Information 2015 [place], [place]. This information is not intended to replace advice given to you by your health care provider. Make sure you discuss any questions you have with your health care provider.  Abdominal Pain, Women Abdominal (stomach, pelvic, or belly) pain can be caused by many things. It is important to tell your doctor:  The location of the pain.  Does it come and go or is it present all the time?  Are there things that start the pain (eating certain foods, exercise)?  Are there other symptoms associated with the pain (fever,  nausea, vomiting, diarrhea)? All of this is helpful to know when trying to find the cause of the pain. CAUSES   Stomach: virus or bacteria infection, or ulcer.  Intestine: appendicitis (inflamed appendix), regional ileitis  (Crohn's disease), ulcerative colitis (inflamed colon), irritable bowel syndrome, diverticulitis (inflamed diverticulum of the colon), or cancer of the stomach or intestine.  Gallbladder disease or stones in the gallbladder.  Kidney disease, kidney stones, or infection.  Pancreas infection or cancer.  Fibromyalgia (pain disorder).  Diseases of the female organs:  Uterus: fibroid (non-cancerous) tumors or infection.  Fallopian tubes: infection or tubal pregnancy.  Ovary: cysts or tumors.  Pelvic adhesions (scar tissue).  Endometriosis (uterus lining tissue growing in the pelvis and on the pelvic organs).  Pelvic congestion syndrome (female organs filling up with blood just before the menstrual period).  Pain with the menstrual period.  Pain with ovulation (producing an egg).  Pain with an IUD (intrauterine device, birth control) in the uterus.  Cancer of the female organs.  Functional pain (pain not caused by a disease, may improve without treatment).  Psychological pain.  Depression. DIAGNOSIS  Your doctor will decide the seriousness of your pain by doing an examination.  Blood tests.  X-rays.  Ultrasound.  CT scan (computed tomography, special type of X-ray).  MRI (magnetic resonance imaging).  Cultures, for infection.  Barium enema (dye inserted in the large intestine, to better view it with X-rays).  Colonoscopy (looking in intestine with a lighted tube).  Laparoscopy (minor surgery, looking in abdomen with a lighted tube).  Major abdominal exploratory surgery (looking in abdomen with a large incision). TREATMENT  The treatment will depend on the cause of the pain.   Many cases can be observed and treated at home.  Over-the-counter medicines recommended by your caregiver.  Prescription medicine.  Antibiotics, for infection.  Birth control pills, for painful periods or for ovulation pain.  Hormone treatment, for endometriosis.  Nerve  blocking injections.  Physical therapy.  Antidepressants.  Counseling with a psychologist or psychiatrist.  Minor or major surgery. HOME CARE INSTRUCTIONS   Do not take laxatives, unless directed by your caregiver.  Take over-the-counter pain medicine only if ordered by your caregiver. Do not take aspirin because it can cause an upset stomach or bleeding.  Try a clear liquid diet (broth or water) as ordered by your caregiver. Slowly move to a bland diet, as tolerated, if the pain is related to the stomach or intestine.  Have a thermometer and take your temperature several times a day, and record it.  Bed rest and sleep, if it helps the pain.  Avoid sexual intercourse, if it causes pain.  Avoid stressful situations.  Keep your follow-up appointments and tests, as your caregiver orders.  If the pain does not go away with medicine or surgery, you may try:  Acupuncture.  Relaxation exercises (yoga, meditation).  Group therapy.  Counseling. SEEK MEDICAL CARE IF:   You notice certain foods cause stomach pain.  Your home care treatment is not helping your pain.  You need stronger pain medicine.  You want your IUD removed.  You feel faint or lightheaded.  You develop nausea and vomiting.  You develop a rash.  You are having side effects or an allergy to your medicine. SEEK IMMEDIATE MEDICAL CARE IF:   Your pain does not go away or gets worse.  You have a fever.  Your pain is felt only in portions of the abdomen. The right side could possibly be  appendicitis. The left lower portion of the abdomen could be colitis or diverticulitis.  You are passing blood in your stools (bright red or black tarry stools, with or without vomiting).  You have blood in your urine.  You develop chills, with or without a fever.  You pass out. MAKE SURE YOU:   Understand these instructions.  Will watch your condition.  Will get help right away if you are not doing well or get  worse. Document Released: 02/01/2007 Document Revised: 08/21/2013 Document Reviewed: 02/21/2009 [facility] Patient Information 2015 [place], [place]. This information is not intended to replace advice given to you by your health care provider. Make sure you discuss any questions you have with your health care provider.  Abdominal Pain Many things can cause abdominal pain. Usually, abdominal pain is not caused by a disease and will improve without treatment. It can often be observed and treated at home. Your health care provider will do a physical exam and possibly order blood tests and X-rays to help determine the seriousness of your pain. However, in many cases, more time must pass before a clear cause of the pain can be found. Before that point, your health care provider may not know if you need more testing or further treatment. HOME CARE INSTRUCTIONS  Monitor your abdominal pain for any changes. The following actions may help to alleviate any discomfort you are experiencing:  Only take over-the-counter or prescription medicines as directed by your health care provider.  Do not take laxatives unless directed to do so by your health care provider.  Try a clear liquid diet (broth, tea, or water) as directed by your health care provider. Slowly move to a bland diet as tolerated. SEEK MEDICAL CARE IF:  You have unexplained abdominal pain.  You have abdominal pain associated with nausea or diarrhea.  You have pain when you urinate or have a bowel movement.  You experience abdominal pain that wakes you in the night.  You have abdominal pain that is worsened or improved by eating food.  You have abdominal pain that is worsened with eating fatty foods.  You have a fever. SEEK IMMEDIATE MEDICAL CARE IF:   Your pain does not go away within 2 hours.  You keep throwing up (vomiting).  Your pain is felt only in portions of the abdomen, such as the right side or the left lower portion of the  abdomen.  You pass bloody or black tarry stools. MAKE SURE YOU:  Understand these instructions.   Will watch your condition.   Will get help right away if you are not doing well or get worse.  Document Released: 01/14/2005 Document Revised: 04/11/2013 Document Reviewed: 12/14/2012 [facility] Patient Information 2015 [place], [place]. This information is not intended to replace advice given to you by your health care provider. Make sure you discuss any questions you have with your health care provider.

## 2014-01-27 NOTE — ED Notes (Signed)
She c/o periumbilical pain plus a couple of episodes of diarrhea x 3 days.  She also mentions mild dysuria and a "whitish" vag. D/c.  She is in no distress.

## 2014-01-27 NOTE — ED Provider Notes (Signed)
CSN: [id number]     Arrival date & time 01/27/14  0901 History   First MD Initiated Contact with Patient 01/27/14 1013     Chief Complaint  Patient presents with  . Abdominal Pain     (Consider location/radiation/quality/duration/timing/severity/associated sxs/prior Treatment) HPI Comments: This is a 43 y/o female who presents to the ED complaining of lower abdominal pain x 3 days. Pt describes the pain as "dull", constant, radiating across her lower abdomen and to her belly button area. No aggravating or alleviating factors. Admits to associated nausea without vomiting. She had 2 episodes of nonbloody diarrhea 2 days ago. She endorses white vaginal discharge, increased urinary frequency and vaginal itching. States her abdomen appears swollen. History of tubal ligation. She is sexually active with one partner and occasionally uses protection. Denies fever, chills, vomiting.  Patient is a 43 y.o. female presenting with abdominal pain. The history is provided by the patient.  Abdominal Pain Associated symptoms: nausea and vaginal discharge     History reviewed. No pertinent past medical history. Past Surgical History  Procedure Laterality Date  . Tubal ligation     No family history on file. History  Substance Use Topics  . Smoking status: Current Every Day Smoker  . Smokeless tobacco: Not on file  . Alcohol Use: Yes   OB History   Grav Para Term Preterm Abortions TAB SAB Ect Mult Living                 Review of Systems  Gastrointestinal: Positive for nausea and abdominal pain.  Genitourinary: Positive for frequency and vaginal discharge.  All other systems reviewed and are negative.     Allergies  Review of patient's allergies indicates no known allergies.  Home Medications   Prior to Admission medications   Medication Sig Start Date End Date Taking? Authorizing Provider  ibuprofen (ADVIL,MOTRIN) 200 MG tablet Take 400 mg by mouth every 6 (six) hours as needed for  fever, headache or mild pain (pain).    Yes Historical Provider, MD  methocarbamol (ROBAXIN) 500 MG tablet Take 500 mg by mouth 2 (two) times daily.   Yes Historical Provider, MD  naproxen sodium (ANAPROX) 220 MG tablet Take 40 mg by mouth daily as needed (pain).   Yes Historical Provider, MD  metroNIDAZOLE (FLAGYL) 500 MG tablet Take 1 tablet (500 mg total) by mouth 2 (two) times daily. One po bid x 7 days 01/27/14   Kathrynn Speedobyn M Lanisha Stepanian, PA-C  traMADol (ULTRAM) 50 MG tablet Take 1 tablet (50 mg total) by mouth every 6 (six) hours as needed. 01/27/14   Lun Muro M Jameca Chumley, PA-C   BP 124/76  Pulse 92  Temp(Src) 98.6 F (37 C) (Oral)  Resp 17  SpO2 94%  LMP 01/08/2014 Physical Exam  Nursing note and vitals reviewed. Constitutional: She is oriented to person, place, and time. She appears well-developed and well-nourished. No distress.  HENT:  Head: Normocephalic and atraumatic.  Mouth/Throat: Oropharynx is clear and moist.  Eyes: Conjunctivae are normal.  Neck: Normal range of motion. Neck supple.  Cardiovascular: Normal rate, regular rhythm and normal heart sounds.   Pulmonary/Chest: Effort normal and breath sounds normal.  Abdominal: Soft. Bowel sounds are normal. She exhibits no distension.  TTP across lower abdomen and periumbilical without rigidity, guarding or rebound. No peritoneal signs. No CVA tenderness.  Genitourinary: Uterus normal. Cervix exhibits no motion tenderness, no discharge and no friability. Right adnexum displays no mass, no tenderness and no fullness. Left adnexum displays no  mass, no tenderness and no fullness. No erythema, tenderness or bleeding around the vagina. Vaginal discharge (thick, white) found.  Musculoskeletal: Normal range of motion. She exhibits no edema.  Neurological: She is alert and oriented to person, place, and time.  Skin: Skin is warm and dry. She is not diaphoretic.  Psychiatric: She has a normal mood and affect. Her behavior is normal.    ED Course   Procedures (including critical care time) Labs Review Labs Reviewed  WET PREP, GENITAL - Abnormal; Notable for the following:    Clue Cells Wet Prep HPF POC FEW (*)    All other components within normal limits  URINALYSIS, ROUTINE W REFLEX MICROSCOPIC - Abnormal; Notable for the following:    APPearance CLOUDY (*)    Leukocytes, UA SMALL (*)    All other components within normal limits  CBC WITH DIFFERENTIAL - Abnormal; Notable for the following:    Hemoglobin 11.6 (*)    HCT 35.2 (*)    Monocytes Relative 13 (*)    All other components within normal limits  COMPREHENSIVE METABOLIC PANEL - Abnormal; Notable for the following:    Total Bilirubin <0.2 (*)    All other components within normal limits  GC/CHLAMYDIA PROBE AMP  PREGNANCY, URINE  URINE MICROSCOPIC-ADD ON    Imaging Review No results found.   EKG Interpretation None      MDM   Final diagnoses:  BV (bacterial vaginosis)  Lower abdominal pain   Patient nontoxic-appearing in no apparent distress. Sitting comfortably on exam bed requesting something warm to drink. Afebrile, vital signs stable. Abdomen is soft with no peritoneal signs. No focal tenderness. Labs about any acute finding. Wet prep positive for clue cells. No CMT or adnexal tenderness. Doubt PID. Doubt ovarian torsion or appendicitis given association with vaginal discharge and no focal tenderness. Will treat with Flagyl. Followup with PCP. Stable for discharge. Return precautions given. Patient states understanding of treatment care plan and is agreeable.  Kathrynn Speedobyn M Cypress Fanfan, PA-C 01/27/14 1233  Kathrynn Speedobyn M Ramanda Paules, PA-C 01/27/14 1234

## 2014-01-27 NOTE — ED Notes (Signed)
I tried to get blood but patient kept on jumping so I was unsuccessful with getting blood

## 2014-01-27 NOTE — ED Provider Notes (Signed)
Medical screening examination/treatment/procedure(s) were performed by non-physician practitioner and as supervising physician I was immediately available for consultation/collaboration.   EKG Interpretation None      Devoria AlbeIva Kamira Mellette, MD, Armando GangFACEP   Ward GivensIva L Tasnia Spegal, MD 01/27/14 [phone]

## 2014-01-29 LAB — GC/CHLAMYDIA PROBE AMP
CT Probe RNA: NEGATIVE
GC Probe RNA: NEGATIVE

## 2015-05-24 IMAGING — CR DG CHEST 2V
2 series · 2 of 2 positions shown · non-contrast
Comparison: None.

CLINICAL DATA: Shortness of breath and cough.

EXAM:
CHEST  2 VIEW

[w chest pa]
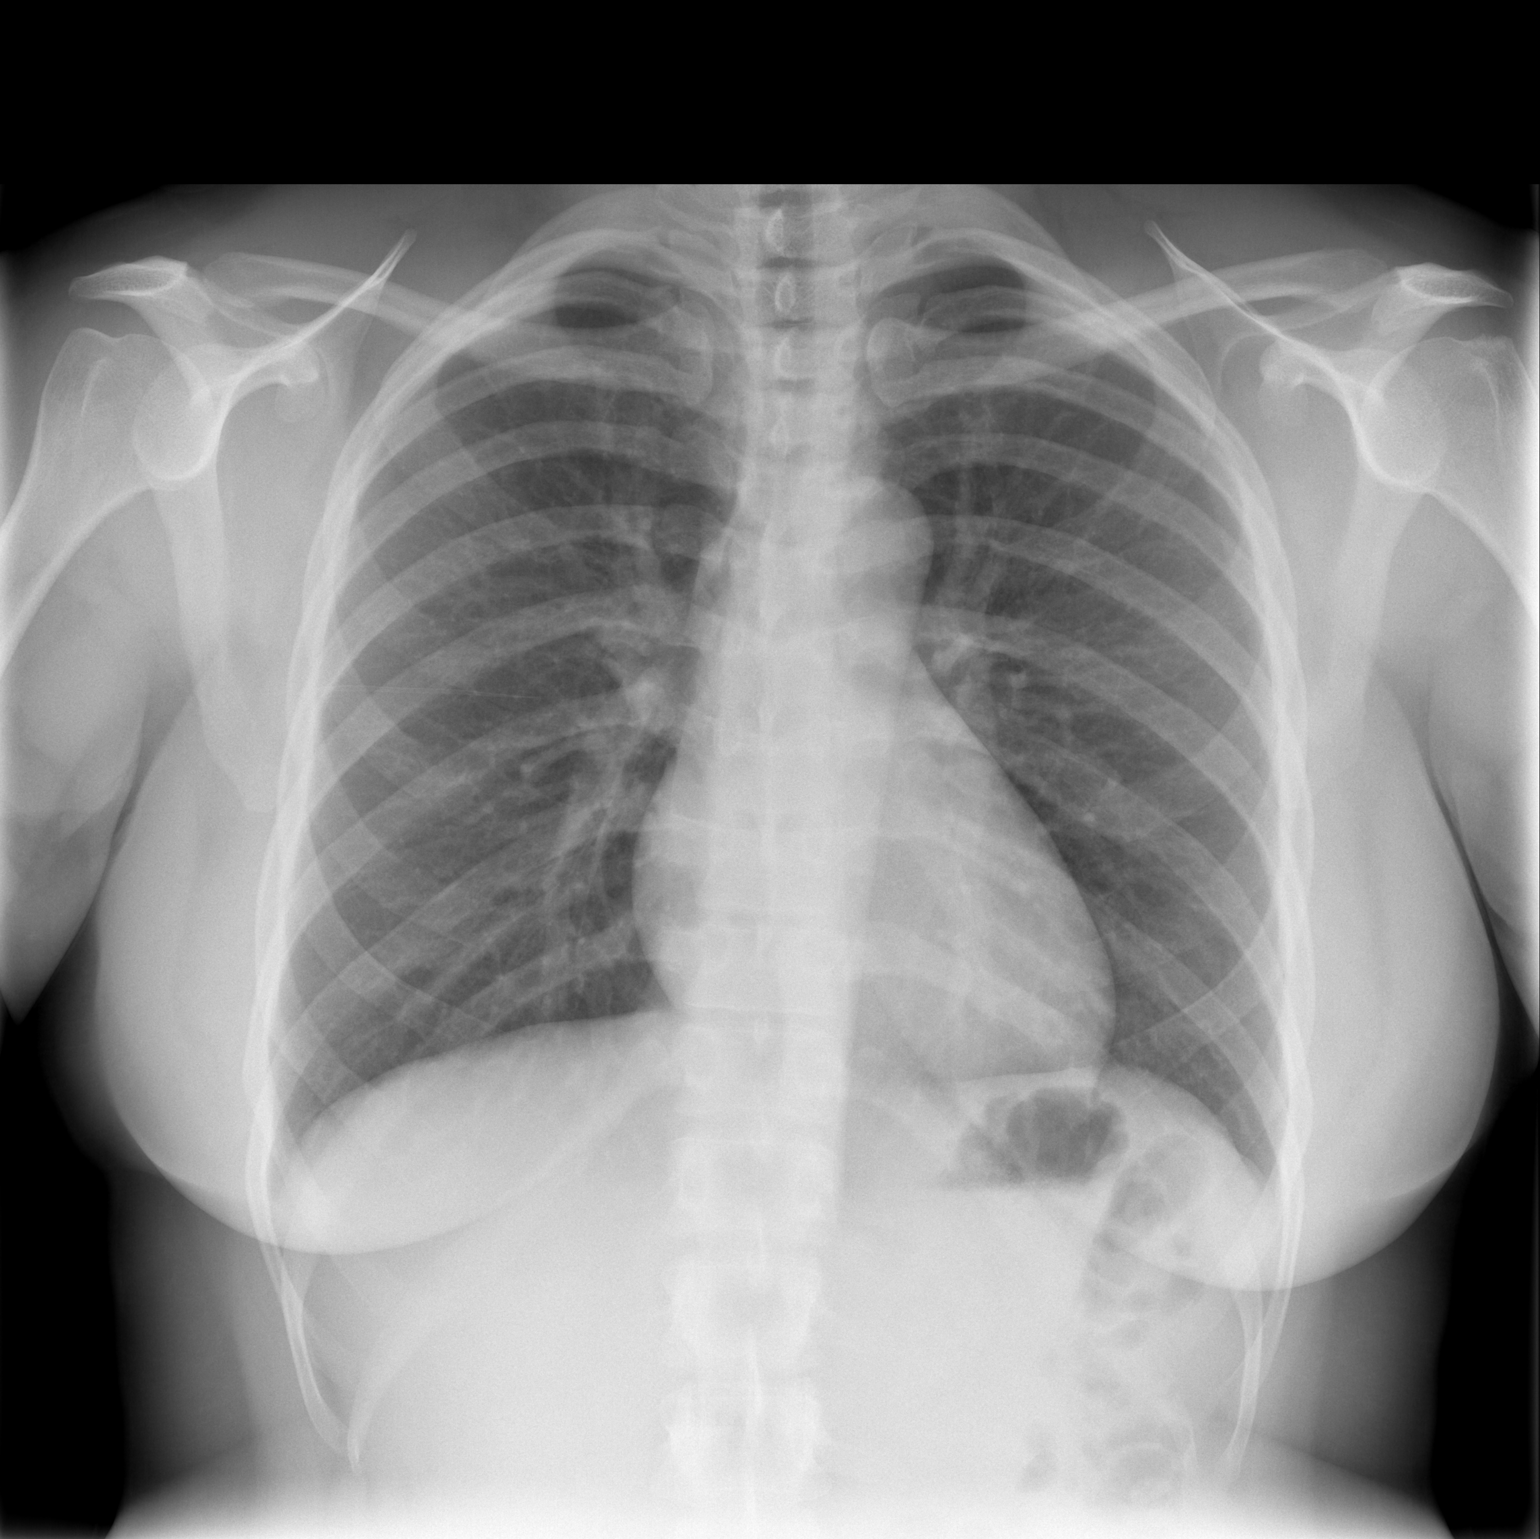

[w chest lat]
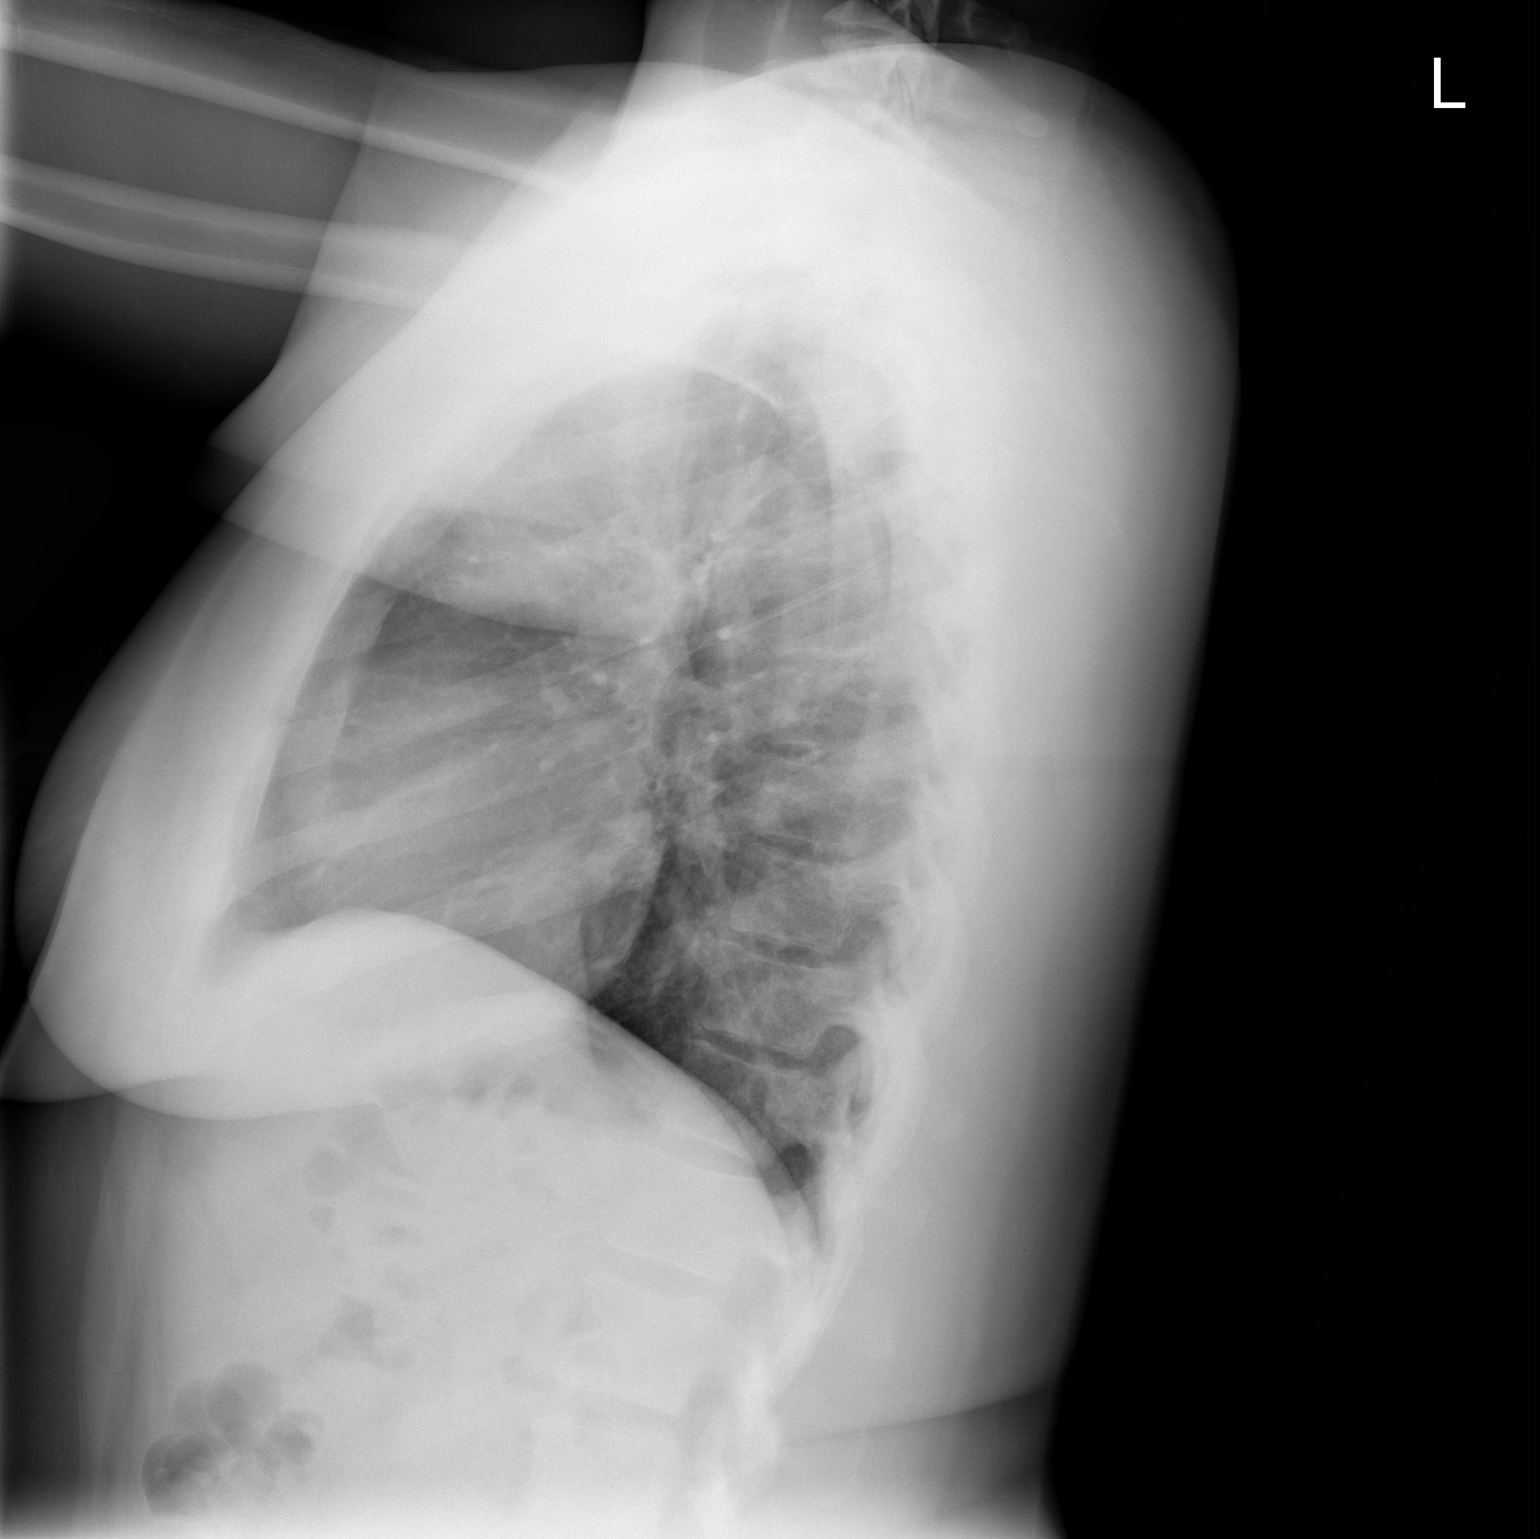

[2 of 2 positions shown; findings below may reference images not displayed]

FINDINGS: The heart size and mediastinal contours are within normal limits.
Both lungs are clear. The visualized skeletal structures are
unremarkable.
IMPRESSION: Normal exam.

## 2018-09-22 ENCOUNTER — Telehealth (HOSPITAL_COMMUNITY): Payer: Self-pay | Admitting: Lactation Services

## 2018-09-22 NOTE — Telephone Encounter (Signed)
Opened in error

## 2020-06-22 ENCOUNTER — Emergency Department (HOSPITAL_COMMUNITY)
Admission: EM | Admit: 2020-06-22 | Discharge: 2020-06-22 | Disposition: A | Discharge status: Home / Self Care | Payer: Self-pay | Attending: Emergency Medicine | Admitting: Emergency Medicine

## 2020-06-22 ENCOUNTER — Encounter (HOSPITAL_COMMUNITY): Payer: Self-pay

## 2020-06-22 ENCOUNTER — Other Ambulatory Visit: Payer: Self-pay

## 2020-06-22 DIAGNOSIS — Z87891 Personal history of nicotine dependence: Secondary | ICD-10-CM | POA: Insufficient documentation

## 2020-06-22 DIAGNOSIS — M25551 Pain in right hip: Secondary | ICD-10-CM | POA: Insufficient documentation

## 2020-06-22 DIAGNOSIS — M5431 Sciatica, right side: Secondary | ICD-10-CM

## 2020-06-22 MED ORDER — METHOCARBAMOL 750 MG PO TABS: 750.0000 mg | ORAL_TABLET | Freq: Four times a day (QID) | ORAL | 0 refills | Status: DC

## 2020-06-22 MED ORDER — IBUPROFEN 600 MG PO TABS: 600.0000 mg | ORAL_TABLET | Freq: Four times a day (QID) | ORAL | 0 refills | Status: DC | PRN

## 2020-06-22 NOTE — ED Provider Notes (Signed)
[facility]-EMERGENCY DEPT Provider Note   CSN: [id number] Arrival date & time: 06/22/20  1014     History Chief Complaint  Patient presents with  . Hip Pain    Tricia Hughes is a 50 y.o. female.  50 year old female presents with recurrent right-sided upper gluteal pain for over a year.  Pain characterizes sharp and worse with any movement.  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  Denies any foot drop when walking.  Has used Tylenol with temporary relief.        History reviewed. No pertinent past medical history.  Patient Active Problem List   Diagnosis Date Noted  . Left leg pain 03/17/2013    Past Surgical History:  Procedure Laterality Date  . TUBAL LIGATION       OB History   No obstetric history on file.     History reviewed. No pertinent family history.  Social History   Tobacco Use  . Smoking status: Former Games developer  . Smokeless tobacco: Never Used  Vaping Use  . Vaping Use: Never used  Substance Use Topics  . Alcohol use: Yes  . Drug use: No    Home Medications Prior to Admission medications   Medication Sig Start Date End Date Taking? Authorizing Provider  ibuprofen (ADVIL,MOTRIN) 200 MG tablet Take 400 mg by mouth every 6 (six) hours as needed for fever, headache or mild pain (pain).     [provider]  methocarbamol (ROBAXIN) 500 MG tablet Take 500 mg by mouth 2 (two) times daily.    [provider]  metroNIDAZOLE (FLAGYL) 500 MG tablet Take 1 tablet (500 mg total) by mouth 2 (two) times daily. One po bid x 7 days 01/27/14   Hess, Robyn M, PA-C  naproxen sodium (ANAPROX) 220 MG tablet Take 40 mg by mouth daily as needed (pain).    [provider]  traMADol (ULTRAM) 50 MG tablet Take 1 tablet (50 mg total) by mouth every 6 (six) hours as needed. 01/27/14   Hess, Nada Boozer, PA-C    Allergies    Patient has no known allergies.  Review of Systems   Review of  Systems  All other systems reviewed and are negative.   Physical Exam Updated Vital Signs BP 126/71 (BP Location: Right Arm)   Pulse 72   Temp 98.7 F (37.1 C) (Oral)   Resp 17   Ht 1.753 m (5\' 9" )   Wt 81.6 kg   LMP 06/16/2020   SpO2 99%   BMI 26.58 kg/m   Physical Exam Vitals and nursing note reviewed.  Constitutional:      General: She is not in acute distress.    Appearance: Normal appearance. She is well-developed and well-nourished. She is not toxic-appearing.  HENT:     Head: Normocephalic and atraumatic.  Eyes:     General: Lids are normal.     Extraocular Movements: EOM normal.     Conjunctiva/sclera: Conjunctivae normal.     Pupils: Pupils are equal, round, and reactive to light.  Neck:     Thyroid: No thyroid mass.     Trachea: No tracheal deviation.  Cardiovascular:     Rate and Rhythm: Normal rate and regular rhythm.     Heart sounds: Normal heart sounds. No murmur heard. No gallop.   Pulmonary:     Effort: Pulmonary effort is normal. No respiratory distress.     Breath sounds: Normal breath sounds. No stridor. No  decreased breath sounds, wheezing, rhonchi or rales.  Abdominal:     General: Bowel sounds are normal. There is no distension.     Palpations: Abdomen is soft.     Tenderness: There is no abdominal tenderness. There is no CVA tenderness or rebound.  Musculoskeletal:        General: No tenderness or edema. Normal range of motion.     Cervical back: Normal range of motion and neck supple.       Legs:     Comments: Neurovascular intact at right foot  Skin:    General: Skin is warm and dry.     Findings: No abrasion or rash.  Neurological:     Mental Status: She is alert and oriented to person, place, and time.     GCS: GCS eye subscore is 4. GCS verbal subscore is 5. GCS motor subscore is 6.     Cranial Nerves: No cranial nerve deficit.     Sensory: No sensory deficit.     Deep Tendon Reflexes: Strength normal.  Psychiatric:        Mood  and Affect: Mood and affect normal.        Speech: Speech normal.        Behavior: Behavior normal.     ED Results / Procedures / Treatments   Labs (all labs ordered are listed, but only abnormal results are displayed) Labs Reviewed - No data to display  EKG None  Radiology No results found.  Procedures Procedures   Medications Ordered in ED Medications - No data to display  ED Course  I have reviewed the triage vital signs and the nursing notes.  Pertinent labs & imaging results that were available during my care of the patient were reviewed by me and considered in my medical decision making (see chart for details).    MDM Rules/Calculators/A&P                          Patient with likely sciatica and we placed the muscle relaxants and anti-inflammatories Final Clinical Impression(s) / ED Diagnoses Final diagnoses:  None    Rx / DC Orders ED Discharge Orders    None       Lorre Nick, MD 06/22/20 1057

## 2020-06-22 NOTE — ED Triage Notes (Signed)
Patient c/o right hip pain that radiates into the right leg x 1 1/2 years, but states that it worsened in November 2021.

## 2020-06-26 ENCOUNTER — Encounter: Payer: Self-pay | Admitting: Critical Care Medicine

## 2020-06-26 NOTE — Progress Notes (Signed)
Patient ID: Tricia Hughes, female   DOB: Sep 20, 1970, 50 y.o.   MRN: [id number] This a 50 year old female who just arrived [facility].  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  Note she was just in urgent care on 5 March complaining of back pain and sciatic nerve impingement.  A prescription for ibuprofen was written but the patient did not pick this up she does not havea ny money to pay for medicines at this timeand she does not have any insurance    Plan today will be to give the patient samples of Naprosyn 220 mg she can take twice daily for pain we also gave her a lidocaine patch over the counter plan will also be for this patient to have a community health and wellness clinic visit within the next week so that she can undertake a complete physical that she needs in order to obtain employment as a CNA

## 2020-07-03 ENCOUNTER — Ambulatory Visit: Payer: Self-pay | Admitting: Critical Care Medicine

## 2020-07-03 NOTE — Progress Notes (Deleted)
Established Patient Office Visit  Subjective:  Patient ID: Tricia Hughes, female    DOB: 15-Feb-1971  Age: 50 y.o. MRN: [id number]  CC: No chief complaint on file.   HPI Tricia Hughes presents for ***  No past medical history on file.  Past Surgical History:  Procedure Laterality Date  . TUBAL LIGATION      No family history on file.  Social History   Socioeconomic History  . Marital status: Single    Spouse name: Not on file  . Number of children: Not on file  . Years of education: Not on file  . Highest education level: Not on file  Occupational History  . Not on file  Tobacco Use  . Smoking status: Former Games developer  . Smokeless tobacco: Never Used  Vaping Use  . Vaping Use: Never used  Substance and Sexual Activity  . Alcohol use: Yes  . Drug use: No  . Sexual activity: Not on file  Other Topics Concern  . Not on file  Social History Narrative  . Not on file   Social Determinants of Health   Financial Resource Strain: Not on file  Food Insecurity: Not on file  Transportation Needs: Not on file  Physical Activity: Not on file  Stress: Not on file  Social Connections: Not on file  Intimate Partner Violence: Not on file    Outpatient Medications Prior to Visit  Medication Sig Dispense Refill  . ibuprofen (ADVIL) 600 MG tablet Take 1 tablet (600 mg total) by mouth every 6 (six) hours as needed. 30 tablet 0  . ibuprofen (ADVIL,MOTRIN) 200 MG tablet Take 400 mg by mouth every 6 (six) hours as needed for fever, headache or mild pain (pain).     . methocarbamol (ROBAXIN) 500 MG tablet Take 500 mg by mouth 2 (two) times daily.    . methocarbamol (ROBAXIN-750) 750 MG tablet Take 1 tablet (750 mg total) by mouth 4 (four) times daily. 30 tablet 0  . metroNIDAZOLE (FLAGYL) 500 MG tablet Take 1 tablet (500 mg total) by mouth 2 (two) times daily. One po bid x 7 days 14 tablet 0  . naproxen sodium (ANAPROX) 220 MG tablet Take 40 mg by mouth daily as  needed (pain).    . traMADol (ULTRAM) 50 MG tablet Take 1 tablet (50 mg total) by mouth every 6 (six) hours as needed. 15 tablet 0   No facility-administered medications prior to visit.    No Known Allergies  ROS Review of Systems    Objective:    Physical Exam  LMP 06/16/2020  Wt Readings from Last 3 Encounters:  06/22/20 81.6 kg  04/06/13 90.7 kg     Health Maintenance Due  Topic Date Due  . Hepatitis C Screening  Never done  . COVID-19 Vaccine (1) Never done  . HIV Screening  Never done  . TETANUS/TDAP  Never done  . PAP SMEAR-Modifier  Never done  . COLONOSCOPY (Pts 45-10yrs Insurance coverage will need to be confirmed)  Never done  . INFLUENZA VACCINE  Never done    There are no preventive care reminders to display for this patient.  No results found for: TSH Lab Results  Component Value Date   WBC 6.4 01/27/2014   HGB 11.6 (L) 01/27/2014   HCT 35.2 (L) 01/27/2014   MCV 84.0 01/27/2014   PLT 217 01/27/2014   Lab Results  Component Value Date   NA 141 01/27/2014   K 4.3 01/27/2014  CO2 23 01/27/2014   GLUCOSE 93 01/27/2014   BUN 12 01/27/2014   CREATININE 0.79 01/27/2014   BILITOT <0.2 (L) 01/27/2014   ALKPHOS 67 01/27/2014   AST 14 01/27/2014   ALT 15 01/27/2014   PROT 7.5 01/27/2014   ALBUMIN 3.5 01/27/2014   CALCIUM 9.3 01/27/2014   ANIONGAP 11 01/27/2014   No results found for: CHOL No results found for: HDL No results found for: LDLCALC No results found for: TRIG No results found for: CHOLHDL No results found for: WNIO2V    Assessment & Plan:   Problem List Items Addressed This Visit   None     No orders of the defined types were placed in this encounter.   Follow-up: No follow-ups on file.    Cyril Loosen, RN

## 2020-07-03 NOTE — Progress Notes (Deleted)
   Subjective:    Patient ID: Tricia Hughes, female    DOB: 1970/07/18, 50 y.o.   MRN: [id number]  50 y.o.F here for employment physical and to establish PCP  [facility] house resident: 06/26/20 This a 50 year old female who just arrived yesterday to the [facility].  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  Note she was just in urgent care on 5 March complaining of back pain and sciatic nerve impingement.  A prescription for ibuprofen was written but the patient did not pick this up she does not havea ny money to pay for medicines at this timeand she does not have any insurance    Plan today will be to give the patient samples of Naprosyn 220 mg she can take twice daily for pain we also gave her a lidocaine patch over the counter plan will also be for this patient to have a community health and wellness clinic visit within the next week so that she can undertake a complete physical that she needs in order to obtain employment as a CNA   07/03/2020      Review of Systems     Objective:   Physical Exam        Assessment & Plan:

## 2021-08-17 ENCOUNTER — Emergency Department (HOSPITAL_COMMUNITY)
Admission: EM | Admit: 2021-08-17 | Discharge: 2021-08-17 | Disposition: A | Discharge status: Home / Self Care | Payer: Self-pay | Attending: Emergency Medicine | Admitting: Emergency Medicine

## 2021-08-17 ENCOUNTER — Emergency Department (HOSPITAL_COMMUNITY): Payer: Self-pay

## 2021-08-17 ENCOUNTER — Other Ambulatory Visit: Payer: Self-pay

## 2021-08-17 ENCOUNTER — Encounter (HOSPITAL_COMMUNITY): Payer: Self-pay

## 2021-08-17 DIAGNOSIS — R0600 Dyspnea, unspecified: Secondary | ICD-10-CM | POA: Insufficient documentation

## 2021-08-17 DIAGNOSIS — R0789 Other chest pain: Secondary | ICD-10-CM | POA: Insufficient documentation

## 2021-08-17 DIAGNOSIS — R5381 Other malaise: Secondary | ICD-10-CM | POA: Insufficient documentation

## 2021-08-17 DIAGNOSIS — F419 Anxiety disorder, unspecified: Secondary | ICD-10-CM | POA: Insufficient documentation

## 2021-08-17 DIAGNOSIS — Z87891 Personal history of nicotine dependence: Secondary | ICD-10-CM | POA: Insufficient documentation

## 2021-08-17 LAB — BASIC METABOLIC PANEL
Anion gap: 4 — ABNORMAL LOW (ref 5–15)
BUN: 13 mg/dL (ref 6–20)
CO2: 25 mmol/L (ref 22–32)
Calcium: 9 mg/dL (ref 8.9–10.3)
Chloride: 110 mmol/L (ref 98–111)
Creatinine, Ser: 0.86 mg/dL (ref 0.44–1.00)
GFR, Estimated: >60 mL/min (ref >60)
Glucose, Bld: 104 mg/dL — ABNORMAL HIGH (ref 70–99)
Potassium: 4.2 mmol/L (ref 3.5–5.1)
Sodium: 139 mmol/L (ref 135–145)

## 2021-08-17 LAB — TSH: TSH: 1.125 u[IU]/mL (ref 0.350–4.500)

## 2021-08-17 LAB — CBC
HCT: 40.9 % (ref 36.0–46.0)
Hemoglobin: 13.3 g/dL (ref 12.0–15.0)
MCH: 27.4 pg (ref 26.0–34.0)
MCHC: 32.5 g/dL (ref 30.0–36.0)
MCV: 84.3 fL (ref 80.0–100.0)
Platelets: 164 10*3/uL (ref 150–400)
RBC: 4.85 MIL/uL (ref 3.87–5.11)
RDW: 17.6 % — ABNORMAL HIGH (ref 11.5–15.5)
WBC: 3.9 10*3/uL — ABNORMAL LOW (ref 4.0–10.5)
nRBC: 0 % (ref 0.0–0.2)

## 2021-08-17 LAB — TROPONIN I (HIGH SENSITIVITY)
Troponin I (High Sensitivity): 3 ng/L (ref <18)
Troponin I (High Sensitivity): 3 ng/L (ref <18)

## 2021-08-17 LAB — D-DIMER, QUANTITATIVE: D-Dimer, Quant: <0.27 ug/mL-FEU (ref 0.00–0.50)

## 2021-08-17 MED ORDER — HYDROXYZINE HCL 25 MG PO TABS: 25.0000 mg | ORAL_TABLET | Freq: Three times a day (TID) | ORAL | 0 refills | Status: DC | PRN

## 2021-08-17 MED ORDER — SODIUM CHLORIDE 0.9 % IV BOLUS
1000.0000 mL | Freq: Once | INTRAVENOUS | Status: AC
  Administered 2021-08-17: 1000 mL via INTRAVENOUS

## 2021-08-17 MED ORDER — DIAZEPAM 5 MG PO TABS
5.0000 mg | ORAL_TABLET | Freq: Once | ORAL | Status: AC
  Administered 2021-08-17: 5 mg via ORAL
  Filled 2021-08-17: qty 1

## 2021-08-17 NOTE — ED Provider Notes (Signed)
?[facility]-EMERGENCY DEPT ?Provider Note ? ? ?CSN: [id number] ?Arrival date & time: 08/17/21  0505 ? ?  ? ?History ? ?Chief Complaint  ?Patient presents with  ? Headache  ? ? ?Tricia Hughes is a 51 y.o. female. ? ? Patient as above with significant medical history as below, including tubal ligation who presents to the ED with complaint of feeling unwell.  Patient reports she smokes Black and mild around midnight to 1 AM.  She began to feel sensation of impending doom, chest tightness, heaviness, difficulty breathing, anxiousness.  She called EMS at that time.  She has never experienced this in the past after smoking Black and milds.  Black and mild cigars was not laced with any other substance.  No recent alcohol use.  No illicit drug use.  At this time she is feeling somewhat better.  Does still feel anxious and has some mild chest tightness but otherwise feels better.  No nausea or vomiting, no numbness or tingling, no headache.  No change in bowel or bladder function.  No change to p.o. intake.  No recent medication or diet changes. ? ? ? ?History reviewed. No pertinent past medical history. ? ?Past Surgical History: ?No date: TUBAL LIGATION  ? ? ?The history is provided by the patient. No language interpreter was used.  ?Headache ?Associated symptoms: no abdominal pain, no cough, no fever, no nausea, no photophobia and no vomiting   ? ?  ? ?Home Medications ?Prior to Admission medications   ?Medication Sig Start Date End Date Taking? Authorizing Provider  ?hydrOXYzine (ATARAX) 25 MG tablet Take 1 tablet (25 mg total) by mouth every 8 (eight) hours as needed. 08/17/21  Yes Tanda RockersGray, Aleza Pew A, DO  ?ibuprofen (ADVIL) 600 MG tablet Take 1 tablet (600 mg total) by mouth every 6 (six) hours as needed. 06/22/20   Lorre NickAllen, Anthony, MD  ?ibuprofen (ADVIL,MOTRIN) 200 MG tablet Take 400 mg by mouth every 6 (six) hours as needed for fever, headache or mild pain (pain).     [provider]   ?methocarbamol (ROBAXIN) 500 MG tablet Take 500 mg by mouth 2 (two) times daily.    [provider]  ?methocarbamol (ROBAXIN-750) 750 MG tablet Take 1 tablet (750 mg total) by mouth 4 (four) times daily. 06/22/20   Lorre NickAllen, Anthony, MD  ?metroNIDAZOLE (FLAGYL) 500 MG tablet Take 1 tablet (500 mg total) by mouth 2 (two) times daily. One po bid x 7 days 01/27/14   Celene SkeenHess, Robyn M, PA-C  ?naproxen sodium (ANAPROX) 220 MG tablet Take 40 mg by mouth daily as needed (pain).    [provider]  ?traMADol (ULTRAM) 50 MG tablet Take 1 tablet (50 mg total) by mouth every 6 (six) hours as needed. 01/27/14   Hess, Nada Boozerobyn M, PA-C  ?   ? ?Allergies    ?Patient has no known allergies.   ? ?Review of Systems   ?Review of Systems  ?Constitutional:  Negative for chills and fever.  ?HENT:  Negative for facial swelling and trouble swallowing.   ?Eyes:  Negative for photophobia and visual disturbance.  ?Respiratory:  Positive for chest tightness and shortness of breath. Negative for cough.   ?Cardiovascular:  Negative for chest pain and palpitations.  ?Gastrointestinal:  Negative for abdominal pain, nausea and vomiting.  ?Endocrine: Negative for polydipsia and polyuria.  ?Genitourinary:  Negative for difficulty urinating and hematuria.  ?Musculoskeletal:  Negative for gait problem and joint swelling.  ?Skin:  Negative for pallor and rash.  ?  Neurological:  Positive for light-headedness. Negative for syncope and headaches.  ?Psychiatric/Behavioral:  Negative for agitation and confusion. The patient is nervous/anxious.   ? ?Physical Exam ?Updated Vital Signs ?BP 134/90   Pulse 68   Temp 98.6 ?F (37 ?C) (Oral)   Resp 15   SpO2 99%  ?Physical Exam ?Vitals and nursing note reviewed.  ?Constitutional:   ?   General: She is not in acute distress. ?   Appearance: Normal appearance.  ?HENT:  ?   Head: Normocephalic and atraumatic. No raccoon eyes, Battle's sign, right periorbital erythema or left periorbital erythema.  ?   Right  Ear: External ear normal.  ?   Left Ear: External ear normal.  ?   Nose: Nose normal.  ?   Mouth/Throat:  ?   Mouth: Mucous membranes are moist.  ?Eyes:  ?   General: No scleral icterus.    ?   Right eye: No discharge.     ?   Left eye: No discharge.  ?   Extraocular Movements: Extraocular movements intact.  ?   Pupils: Pupils are equal, round, and reactive to light.  ?Cardiovascular:  ?   Rate and Rhythm: Normal rate and regular rhythm.  ?   Pulses: Normal pulses.     ?     Radial pulses are 2+ on the right side and 2+ on the left side.  ?   Heart sounds: Normal heart sounds.  ?  No S3 or S4 sounds.  ?Pulmonary:  ?   Effort: Pulmonary effort is normal. No respiratory distress.  ?   Breath sounds: Normal breath sounds.  ?Abdominal:  ?   General: Abdomen is flat.  ?   Tenderness: There is no abdominal tenderness.  ?Musculoskeletal:     ?   General: Normal range of motion.  ?   Cervical back: Normal range of motion.  ?   Right lower leg: No edema.  ?   Left lower leg: No edema.  ?Skin: ?   General: Skin is warm and dry.  ?   Capillary Refill: Capillary refill takes less than 2 seconds.  ?Neurological:  ?   Mental Status: She is alert and oriented to person, place, and time.  ?   GCS: GCS eye subscore is 4. GCS verbal subscore is 5. GCS motor subscore is 6.  ?   Cranial Nerves: Cranial nerves 2-12 are intact. No dysarthria or facial asymmetry.  ?   Sensory: Sensation is intact.  ?   Motor: Motor function is intact.  ?   Coordination: Coordination is intact.  ?Psychiatric:     ?   Mood and Affect: Mood normal.     ?   Behavior: Behavior normal.  ? ? ?ED Results / Procedures / Treatments   ?Labs ?(all labs ordered are listed, but only abnormal results are displayed) ?Labs Reviewed  ?BASIC METABOLIC PANEL - Abnormal; Notable for the following components:  ?    Result Value  ? Glucose, Bld 104 (*)   ? Anion gap 4 (*)   ? All other components within normal limits  ?CBC - Abnormal; Notable for the following components:  ?  WBC 3.9 (*)   ? RDW 17.6 (*)   ? All other components within normal limits  ?TSH  ?D-DIMER, QUANTITATIVE  ?TROPONIN I (HIGH SENSITIVITY)  ?TROPONIN I (HIGH SENSITIVITY)  ? ? ?EKG ?EKG Interpretation ? ?Date/Time:  Sunday August 17 2021 10:26:16 EDT ?Ventricular Rate:  57 ?PR Interval:  167 ?QRS Duration: 96 ?QT Interval:  362 ?QTC Calculation: 353 ?R Axis:   59 ?Text Interpretation: Sinus rhythm Anteroseptal infarct, old Borderline ST elevation, lateral leads No old tracing to compare Early repolarization Confirmed by Tanda Rockers ([id number]) on 08/17/2021 10:27:47 AM ? ?Radiology ?DG Chest Port 1 View ? ?Result Date: 08/17/2021 ?CLINICAL DATA:  Mid chest pain, shortness of breath and weakness. EXAM: PORTABLE CHEST 1 VIEW COMPARISON:  04/06/2013 FINDINGS: Heart size and mediastinal contours appear normal. No pleural effusion or interstitial edema. No airspace opacities identified. Visualized osseous structures appear intact. IMPRESSION: No active disease. Electronically Signed   By: Signa Kell M.D.   On: 08/17/2021 12:35   ? ?Procedures ?Procedures  ? ? ?Medications Ordered in ED ?Medications  ?sodium chloride 0.9 % bolus 1,000 mL (0 mLs Intravenous Stopped 08/17/21 1225)  ?diazepam (VALIUM) tablet 5 mg (5 mg Oral Given 08/17/21 1053)  ? ? ?ED Course/ Medical Decision Making/ A&P ?  ?                        ?Medical Decision Making ?Amount and/or Complexity of Data Reviewed ?Labs: ordered. ?Radiology: ordered. ? ?Risk ?Prescription drug management. ? ? ? ?CC: Feeling of impending doom, chest tightness, malaise ? ?This patient presents to the Emergency Department for the above complaint. This involves an extensive number of treatment options and is a complaint that carries with it a high risk of complications and morbidity. Vital signs were reviewed. Serious etiologies considered. ? ?Differential includes was not limited to, ACS, PE, PTX, pneumonia, CHF, anxiety, substance induced, viral ? ?Record review:  ?Previous records  obtained and reviewed  ?Prior ED visit, prior labs and imaging.  PDMP ? ?Additional history obtained from n/a ? ?Medical and surgical history as noted above.  ? ?Work up as above, notable for:  ?Labs & imaging re

## 2021-08-17 NOTE — Discharge Instructions (Addendum)
It was a pleasure caring for you today in the emergency department. ° °Please return to the emergency department for any worsening or worrisome symptoms. ° ° °

## 2021-08-17 NOTE — ED Triage Notes (Signed)
Patient BIB [facility] from home. Having generalized weakness. Smoked a black and mild cigar, felt like all the blood left her head. Smoked it 20 minutes ago.  ?

## 2021-12-26 ENCOUNTER — Other Ambulatory Visit: Payer: Self-pay

## 2021-12-26 ENCOUNTER — Encounter (HOSPITAL_COMMUNITY): Payer: Self-pay

## 2021-12-26 ENCOUNTER — Emergency Department (HOSPITAL_COMMUNITY)
Admission: EM | Admit: 2021-12-26 | Discharge: 2021-12-27 | Discharge status: Against Medical Advice | Payer: Self-pay | Attending: Emergency Medicine | Admitting: Emergency Medicine

## 2021-12-26 DIAGNOSIS — R110 Nausea: Secondary | ICD-10-CM | POA: Insufficient documentation

## 2021-12-26 DIAGNOSIS — H9209 Otalgia, unspecified ear: Secondary | ICD-10-CM | POA: Insufficient documentation

## 2021-12-26 DIAGNOSIS — Z5321 Procedure and treatment not carried out due to patient leaving prior to being seen by health care provider: Secondary | ICD-10-CM | POA: Insufficient documentation

## 2021-12-26 DIAGNOSIS — L299 Pruritus, unspecified: Secondary | ICD-10-CM | POA: Insufficient documentation

## 2021-12-26 NOTE — ED Triage Notes (Addendum)
Pt is having itching in her ears and throat for 4 days along with nausea.

## 2022-08-18 ENCOUNTER — Telehealth: Payer: Self-pay | Admitting: Physician Assistant

## 2022-08-18 ENCOUNTER — Ambulatory Visit: Payer: Self-pay | Admitting: Physician Assistant

## 2022-08-18 ENCOUNTER — Encounter: Payer: Self-pay | Admitting: Physician Assistant

## 2022-08-18 ENCOUNTER — Ambulatory Visit: Payer: Self-pay | Attending: Physician Assistant

## 2022-08-18 VITALS — BP 118/82 | Ht 69.0 in | Wt 193.0 lb

## 2022-08-18 DIAGNOSIS — Z113 Encounter for screening for infections with a predominantly sexual mode of transmission: Secondary | ICD-10-CM

## 2022-08-18 DIAGNOSIS — Z13220 Encounter for screening for lipoid disorders: Secondary | ICD-10-CM

## 2022-08-18 DIAGNOSIS — Z5900 Homelessness unspecified: Secondary | ICD-10-CM | POA: Insufficient documentation

## 2022-08-18 DIAGNOSIS — D709 Neutropenia, unspecified: Secondary | ICD-10-CM

## 2022-08-18 DIAGNOSIS — N951 Menopausal and female climacteric states: Secondary | ICD-10-CM | POA: Insufficient documentation

## 2022-08-18 DIAGNOSIS — Z1231 Encounter for screening mammogram for malignant neoplasm of breast: Secondary | ICD-10-CM

## 2022-08-18 DIAGNOSIS — Z0000 Encounter for general adult medical examination without abnormal findings: Secondary | ICD-10-CM

## 2022-08-18 LAB — GLUCOSE, POCT (MANUAL RESULT ENTRY): Glucose Fasting, POC: 105 mg/dL — AB (ref 70–99)

## 2022-08-18 NOTE — Progress Notes (Signed)
Pt given SDOH handouts and information on PCP.

## 2022-08-18 NOTE — Progress Notes (Signed)
New Patient Office Visit  Subjective    Patient ID: Tricia Hughes, female    DOB: 1971-02-15  Age: 52 y.o. MRN: [id number]  CC:  Chief Complaint  Patient presents with   Establish Care    Desires Blood std screening, Referral for Dermatology, and financial assistance application.     HPI Tricia Hughes states that she does not currently have a primary care physician and is currently without health insurance.  Requests screening for STDs, denies any known exposure, denies any STI symptoms.  States that she gets a bump approximately 1-2 times a year on her gluteal cleft.  States it becomes itchy and irritated, states that will have a small amount of draining and then dry up.  States it is not there now.  States that this has been going on for the past 4 to 5 years.  States that her menses has been irregular, states that she went 2 months without a menses and is currently having a menses right now that has lasted a week.   Outpatient Encounter Medications as of 08/18/2022  Medication Sig   [DISCONTINUED] hydrOXYzine (ATARAX) 25 MG tablet Take 1 tablet (25 mg total) by mouth every 8 (eight) hours as needed. (Patient not taking: Reported on 08/18/2022)   [DISCONTINUED] ibuprofen (ADVIL) 600 MG tablet Take 1 tablet (600 mg total) by mouth every 6 (six) hours as needed. (Patient not taking: Reported on 08/18/2022)   [DISCONTINUED] ibuprofen (ADVIL,MOTRIN) 200 MG tablet Take 400 mg by mouth every 6 (six) hours as needed for fever, headache or mild pain (pain).  (Patient not taking: Reported on 08/18/2022)   [DISCONTINUED] methocarbamol (ROBAXIN) 500 MG tablet Take 500 mg by mouth 2 (two) times daily. (Patient not taking: Reported on 08/18/2022)   [DISCONTINUED] methocarbamol (ROBAXIN-750) 750 MG tablet Take 1 tablet (750 mg total) by mouth 4 (four) times daily. (Patient not taking: Reported on 08/18/2022)   [DISCONTINUED] metroNIDAZOLE (FLAGYL) 500 MG tablet Take 1 tablet (500 mg  total) by mouth 2 (two) times daily. One po bid x 7 days (Patient not taking: Reported on 08/18/2022)   [DISCONTINUED] naproxen sodium (ANAPROX) 220 MG tablet Take 40 mg by mouth daily as needed (pain). (Patient not taking: Reported on 08/18/2022)   [DISCONTINUED] traMADol (ULTRAM) 50 MG tablet Take 1 tablet (50 mg total) by mouth every 6 (six) hours as needed. (Patient not taking: Reported on 08/18/2022)   No facility-administered encounter medications on file as of 08/18/2022.    History reviewed. No pertinent past medical history.  Past Surgical History:  Procedure Laterality Date   TUBAL LIGATION      History reviewed. No pertinent family history.  Social History   Socioeconomic History   Marital status: Single    Spouse name: Not on file   Number of children: Not on file   Years of education: Not on file   Highest education level: Not on file  Occupational History   Not on file  Tobacco Use   Smoking status: Former   Smokeless tobacco: Never  Vaping Use   Vaping Use: Never used  Substance and Sexual Activity   Alcohol use: Yes   Drug use: No   Sexual activity: Not on file  Other Topics Concern   Not on file  Social History Narrative   Not on file   Social Determinants of Health   Financial Resource Strain: Not on file  Food Insecurity: No Food Insecurity (08/18/2022)   Hunger Vital Sign  Worried About Programme researcher, broadcasting/film/video in the Last Year: Never true    Ran Out of Food in the Last Year: Never true  Transportation Needs: No Transportation Needs (08/18/2022)   PRAPARE - Administrator, Civil Service (Medical): No    Lack of Transportation (Non-Medical): No  Physical Activity: Not on file  Stress: Not on file  Social Connections: Not on file  Intimate Partner Violence: Not At Risk (08/18/2022)   Humiliation, Afraid, Rape, and Kick questionnaire    Fear of Current or Ex-Partner: No    Emotionally Abused: No    Physically Abused: No    Sexually Abused:  No    Review of Systems  Constitutional: Negative.   HENT: Negative.    Eyes: Negative.   Respiratory:  Negative for shortness of breath.   Cardiovascular:  Negative for chest pain.  Gastrointestinal:  Negative for abdominal pain, nausea and vomiting.  Genitourinary:  Negative for dysuria, frequency, hematuria and urgency.  Musculoskeletal: Negative.   Skin: Negative.   Neurological: Negative.   Endo/Heme/Allergies: Negative.   Psychiatric/Behavioral: Negative.          Objective    BP 118/82 (BP Location: Left Arm, Patient Position: Sitting, Cuff Size: Normal)   Ht 5\' 9"  (1.753 m)   Wt 193 lb (87.5 kg)   LMP 08/10/2022   SpO2 98%   BMI 28.50 kg/m   Physical Exam Vitals and nursing note reviewed.  Constitutional:      Appearance: Normal appearance.  HENT:     Head: Normocephalic and atraumatic.     Right Ear: External ear normal.     Left Ear: External ear normal.     Nose: Nose normal.     Mouth/Throat:     Mouth: Mucous membranes are moist.     Pharynx: Oropharynx is clear.  Eyes:     Extraocular Movements: Extraocular movements intact.     Conjunctiva/sclera: Conjunctivae normal.     Pupils: Pupils are equal, round, and reactive to light.  Cardiovascular:     Rate and Rhythm: Normal rate and regular rhythm.     Pulses: Normal pulses.     Heart sounds: Normal heart sounds.  Pulmonary:     Effort: Pulmonary effort is normal.     Breath sounds: Normal breath sounds.  Musculoskeletal:        General: Normal range of motion.     Cervical back: Normal range of motion and neck supple.  Skin:    General: Skin is warm and dry.  Neurological:     General: No focal deficit present.     Mental Status: She is alert.  Psychiatric:        Mood and Affect: Mood normal.        Behavior: Behavior normal.        Thought Content: Thought content normal.        Judgment: Judgment normal.         Assessment & Plan:   Problem List Items Addressed This Visit        Other   Perimenopause   Homelessness   Other Visit Diagnoses     Wellness examination    -  Primary   Relevant Orders   CBC with Differential/Platelet   Comp. Metabolic Panel (12)   Lipid panel   TSH   Screen for STD (sexually transmitted disease)       Relevant Orders   HIV antibody (with reflex)   HCV Ab w  Reflex to Quant PCR   Cervicovaginal ancillary only   RPR   Screening mammogram for breast cancer       Relevant Orders   MM DIGITAL SCREENING BILATERAL   Screening, lipid         1. Wellness examination Patient is currently fasting, will complete fasting labs at this time.  Patient given on general health maintenance.  Patient given application for [place] financial assistance.  Patient prefers to return to mobile unit for primary care needs - CBC with Differential/Platelet - Comp. Metabolic Panel (12) - Lipid panel - TSH  2. Perimenopause Patient education given on supportive care  3. Screen for STD (sexually transmitted disease)  - HIV antibody (with reflex) - HCV Ab w Reflex to Quant PCR - Cervicovaginal ancillary only - RPR  4. Screening mammogram for breast cancer Scholarship completed on patient's behalf - MM DIGITAL SCREENING BILATERAL; Future  5. Screening, lipid   6. Homelessness   I have reviewed the patient's medical history (PMH, PSH, Social History, Family History, Medications, and allergies) , and have been updated if relevant. I spent 30 minutes reviewing chart and  face to face time with patient.     Return if symptoms worsen or fail to improve.   Kasandra Knudsen Mayers, PA-C

## 2022-08-18 NOTE — Patient Instructions (Signed)
We will call you with today's lab results.  Please return to the mobile units with you have a recurrence of the bump.  Roney Jaffe, PA-C Physician Assistant [facility] Mobile Medicine [email or web address]   Perimenopause Perimenopause is the normal time of a woman's life when the levels of estrogen, the female hormone produced by the ovaries, begin to decrease. This leads to changes in menstrual periods before they stop completely (menopause). Perimenopause can begin 2-8 years before menopause. During perimenopause, the ovaries may or may not produce an egg and a woman can still become pregnant. What are the causes? This condition is caused by a natural change in hormone levels that happens as you get older. What increases the risk? This condition is more likely to start at an earlier age if you have certain medical conditions or have undergone treatments, including: A tumor of the pituitary gland in the brain. A disease that affects the ovaries and hormone production. Certain cancer treatments, such as chemotherapy or hormone therapy, or radiation therapy on the pelvis. Heavy smoking and excessive alcohol use. Family history of early menopause. What are the signs or symptoms? Perimenopausal changes affect each woman differently. Symptoms of this condition may include: Hot flashes. Irregular menstrual periods. Night sweats. Changes in feelings about sex. This could be a decrease in sex drive or an increased discomfort around your sexuality. Vaginal dryness. Headaches. Mood swings. Depression. Problems sleeping (insomnia). Memory problems or trouble concentrating. Irritability. Tiredness. Weight gain. Anxiety. Trouble getting pregnant. How is this diagnosed? This condition is diagnosed based on your medical history, a physical exam, your age, your menstrual history, and your symptoms. Hormone tests may also be done. How is this  treated? In some cases, no treatment is needed. You and your health care provider should make a decision together about whether treatment is necessary. Treatment will be based on your individual condition and preferences. Various treatments are available, such as: Menopausal hormone therapy (MHT). Medicines to treat specific symptoms. Acupuncture. Vitamin or herbal supplements. Before starting treatment, make sure to let your health care provider know if you have a personal or family history of: Heart disease. Breast cancer. Blood clots. Diabetes. Osteoporosis. Follow these instructions at home: Medicines Take over-the-counter and prescription medicines only as told by your health care provider. Take vitamin supplements only as told by your health care provider. Talk with your health care provider before starting any herbal supplements. Lifestyle  Do not use any products that contain nicotine or tobacco, such as cigarettes, e-cigarettes, and chewing tobacco. If you need help quitting, ask your health care provider. Get at least 30 minutes of physical activity on 5 or more days each week. Eat a balanced diet that includes fresh fruits and vegetables, whole grains, soybeans, eggs, lean meat, and low-fat dairy. Avoid alcoholic and caffeinated beverages, as well as spicy foods. This may help prevent hot flashes. Get 7-8 hours of sleep each night. Dress in layers that can be removed to help you manage hot flashes. Find ways to manage stress, such as deep breathing, meditation, or journaling. General instructions  Keep track of your menstrual periods, including: When they occur. How heavy they are and how long they last. How much time passes between periods. Keep track of your symptoms, noting when they start, how often you have them, and how long they last. Use vaginal lubricants or moisturizers to help with vaginal dryness and improve comfort during sex. You can still become pregnant if  you are having irregular periods.  Make sure you use contraception during perimenopause if you do not want to get pregnant. Keep all follow-up visits. This is important. This includes any group therapy or counseling. Contact a health care provider if: You have heavy vaginal bleeding or pass blood clots. Your period lasts more than 2 days longer than normal. Your periods are recurring sooner than 21 days. You bleed after having sex. You have pain during sex. Get help right away if you have: Chest pain, trouble breathing, or trouble talking. Severe depression. Pain when you urinate. Severe headaches. Vision problems. Summary Perimenopause is the time when a woman's body begins to move into menopause. This may happen naturally or as a result of other health problems or medical treatments. Perimenopause can begin 2-8 years before menopause, and it can last for several years. Perimenopausal symptoms can be managed through medicines, lifestyle changes, and complementary therapies such as acupuncture. This information is not intended to replace advice given to you by your health care provider. Make sure you discuss any questions you have with your health care provider. Document Revised: 09/21/2019 Document Reviewed: 09/21/2019 Elsevier Patient Education  2023 [employer].

## 2022-08-18 NOTE — Telephone Encounter (Signed)
Spoke w/ pt about CAFA and docs needed - going to email her about what we discussed. Heading upstairs to connect her with DSS to apply for Medicaid, phone app.

## 2022-08-19 LAB — COMP. METABOLIC PANEL (12)
AST: 24 IU/L (ref 0–40)
Albumin/Globulin Ratio: 1.6 (ref 1.2–2.2)
Albumin: 4.6 g/dL (ref 3.9–4.9)
Alkaline Phosphatase: 70 IU/L (ref 44–121)
BUN/Creatinine Ratio: 18 (ref 9–23)
BUN: 13 mg/dL (ref 6–24)
Bilirubin Total: 0.4 mg/dL (ref 0.0–1.2)
Calcium: 9.5 mg/dL (ref 8.7–10.2)
Chloride: 109 mmol/L — ABNORMAL HIGH (ref 96–106)
Creatinine, Ser: 0.72 mg/dL (ref 0.57–1.00)
Globulin, Total: 2.9 g/dL (ref 1.5–4.5)
Glucose: 95 mg/dL (ref 70–99)
Potassium: 4.1 mmol/L (ref 3.5–5.2)
Sodium: 144 mmol/L (ref 134–144)
Total Protein: 7.5 g/dL (ref 6.0–8.5)
eGFR: 102 mL/min/{1.73_m2} (ref >59)

## 2022-08-19 LAB — CBC WITH DIFFERENTIAL/PLATELET
Basophils Absolute: 0 10*3/uL (ref 0.0–0.2)
Basos: 1 %
EOS (ABSOLUTE): 0.1 10*3/uL (ref 0.0–0.4)
Eos: 2 %
Hematocrit: 42.2 % (ref 34.0–46.6)
Hemoglobin: 13.6 g/dL (ref 11.1–15.9)
Immature Grans (Abs): 0 10*3/uL (ref 0.0–0.1)
Immature Granulocytes: 0 %
Lymphocytes Absolute: 1.6 10*3/uL (ref 0.7–3.1)
Lymphs: 53 %
MCH: 27.7 pg (ref 26.6–33.0)
MCHC: 32.2 g/dL (ref 31.5–35.7)
MCV: 86 fL (ref 79–97)
Monocytes Absolute: 0.3 10*3/uL (ref 0.1–0.9)
Monocytes: 10 %
Neutrophils Absolute: 1.1 10*3/uL — ABNORMAL LOW (ref 1.4–7.0)
Neutrophils: 34 %
Platelets: 229 10*3/uL (ref 150–450)
RBC: 4.91 x10E6/uL (ref 3.77–5.28)
RDW: 12.5 % (ref 11.7–15.4)
WBC: 3.1 10*3/uL — ABNORMAL LOW (ref 3.4–10.8)

## 2022-08-19 LAB — LIPID PANEL
Chol/HDL Ratio: 2.5 ratio (ref 0.0–4.4)
Cholesterol, Total: 136 mg/dL (ref 100–199)
HDL: 55 mg/dL (ref >39)
LDL Chol Calc (NIH): 70 mg/dL (ref 0–99)
Triglycerides: 51 mg/dL (ref 0–149)
VLDL Cholesterol Cal: 11 mg/dL (ref 5–40)

## 2022-08-19 LAB — RPR: RPR Ser Ql: NONREACTIVE

## 2022-08-19 LAB — HIV ANTIBODY (ROUTINE TESTING W REFLEX): HIV Screen 4th Generation wRfx: NONREACTIVE

## 2022-08-19 LAB — HCV AB W REFLEX TO QUANT PCR: HCV Ab: NONREACTIVE

## 2022-08-19 LAB — TSH: TSH: 0.903 u[IU]/mL (ref 0.450–4.500)

## 2022-08-19 LAB — HCV INTERPRETATION

## 2022-08-19 NOTE — Addendum Note (Signed)
Addended by: Roney Jaffe on: 08/19/2022 09:44 AM   Modules accepted: Orders

## 2022-08-26 ENCOUNTER — Telehealth: Payer: Self-pay

## 2022-08-26 NOTE — Telephone Encounter (Signed)
Follow up call placed, to check status of recent application.

## 2022-08-26 NOTE — Telephone Encounter (Signed)
Good Morning,   Patient called [employer] mobile to check the status of financial application. She mentioned she uploaded needed documents to your email. Could you follow up with patient.

## 2022-08-27 ENCOUNTER — Telehealth: Payer: Self-pay | Admitting: Physician Assistant

## 2022-08-27 NOTE — Telephone Encounter (Signed)
Encounter created  In error.

## 2022-08-27 NOTE — Telephone Encounter (Signed)
Pt to provide paystub from 08/07/2022, last 90 days of complete bank statements from [facility] acct, 2023 tax return; F/U on questions a/b [employer] Scholarship

## 2022-09-02 ENCOUNTER — Encounter: Payer: Self-pay | Admitting: *Deleted

## 2022-09-02 NOTE — Progress Notes (Signed)
The patient attended 08/18/22 screening event where Her BP screening results were within normal limits and her blood glucose was 105 mg/dl. At the event the patient noted she had housing, and food insecurities and no PCP. Patient seen by Maurene Capes PA-C on the mobile unit for a wellness exam on 08/18/22, at the time of the event. Patient was given [facility] Resources and information on obtaining a PCP at the event. The Pt did not notate having insurance. Per chart review there are no past PCP visits in the last 12 months nor any future appts. Chart review also indicates no future appts. A VM was left for the Pt to return my call.  Pt returned my call and confirmed she received SDOH handouts at the event. She stated she needed a PCP still. I was able to schedule her a primary appt while she was on the phone for 10/14/22 .   I told her I would send additional resources that may not have been at the event. She also stated she did not have insurance and was denied for medicaid an Theatre manager will be mailed out as well. A 60 day follow up will be done to see if appt was kept and if insurance was obtained.    Hewitt Shorts, [facility] Guide

## 2022-09-29 ENCOUNTER — Telehealth: Payer: Self-pay | Admitting: Physician Assistant

## 2022-09-29 NOTE — Telephone Encounter (Signed)
Called pt to schedule lab appt, no answer. LVM to give a call back to get scheduled.

## 2022-09-29 NOTE — Telephone Encounter (Signed)
Called pt to schedule lab appt, no answer.LVM to call back.

## 2022-10-14 ENCOUNTER — Ambulatory Visit: Payer: Self-pay | Admitting: Emergency Medicine

## 2022-10-30 NOTE — Progress Notes (Signed)
The patient attended 08/18/22 screening event where Her BP screening results were within normal limits and her blood glucose was 105 mg/dl. At the event the patient noted she had housing, and food insecurities and no PCP. Patient was given [facility] Resources and information on obtaining a PCP at the event. The Pt did not notate having insurance. Per chart review there are no past PCP visits in the last 12 months nor any future appts. Chart review also indicates no future appts.   I Called the Pt on 10/30/22 and the Pt stated the the appt I scheduled for her on 6/29 was cancelled via Mychart. Chart review indicated it was a no show. I asked the Pt did she ever get insurance and she stated she signed up for one last night online and was approved but wasn't sure if it was legit or not. I asked about her sugar and she stated she would check her sugar tonight and call me back with the results. She stated she still has a housing SDOH insecurity. I will did a [employer] referral for housing. A six month follow up is needed.

## 2022-12-20 ENCOUNTER — Other Ambulatory Visit: Payer: Self-pay

## 2022-12-20 ENCOUNTER — Emergency Department (HOSPITAL_COMMUNITY): Admission: EM | Admit: 2022-12-20 | Discharge: 2022-12-21 | Discharge status: Against Medical Advice | Payer: 59

## 2022-12-20 ENCOUNTER — Emergency Department (HOSPITAL_COMMUNITY): Payer: 59

## 2022-12-20 DIAGNOSIS — Z5321 Procedure and treatment not carried out due to patient leaving prior to being seen by health care provider: Secondary | ICD-10-CM | POA: Insufficient documentation

## 2022-12-20 DIAGNOSIS — R079 Chest pain, unspecified: Secondary | ICD-10-CM | POA: Diagnosis not present

## 2022-12-20 DIAGNOSIS — R070 Pain in throat: Secondary | ICD-10-CM | POA: Insufficient documentation

## 2022-12-20 LAB — CBC
HCT: 42.6 % (ref 36.0–46.0)
Hemoglobin: 13.9 g/dL (ref 12.0–15.0)
MCH: 27.5 pg (ref 26.0–34.0)
MCHC: 32.6 g/dL (ref 30.0–36.0)
MCV: 84.2 fL (ref 80.0–100.0)
Platelets: 238 10*3/uL (ref 150–400)
RBC: 5.06 MIL/uL (ref 3.87–5.11)
RDW: 12.4 % (ref 11.5–15.5)
WBC: 3.2 10*3/uL — ABNORMAL LOW (ref 4.0–10.5)
nRBC: 0 % (ref 0.0–0.2)

## 2022-12-20 LAB — BASIC METABOLIC PANEL
Anion gap: 8 (ref 5–15)
BUN: 9 mg/dL (ref 6–20)
CO2: 25 mmol/L (ref 22–32)
Calcium: 9.6 mg/dL (ref 8.9–10.3)
Chloride: 102 mmol/L (ref 98–111)
Creatinine, Ser: 0.82 mg/dL (ref 0.44–1.00)
GFR, Estimated: >60 mL/min (ref >60)
Glucose, Bld: 118 mg/dL — ABNORMAL HIGH (ref 70–99)
Potassium: 3.6 mmol/L (ref 3.5–5.1)
Sodium: 135 mmol/L (ref 135–145)

## 2022-12-20 LAB — HCG, SERUM, QUALITATIVE: Preg, Serum: NEGATIVE

## 2022-12-20 LAB — TROPONIN I (HIGH SENSITIVITY): Troponin I (High Sensitivity): 4 ng/L (ref <18)

## 2022-12-20 NOTE — ED Triage Notes (Signed)
Pt BIB[facility]. C/o throat tightness, and burning in chest for 3x days.  AOx4

## 2022-12-23 ENCOUNTER — Other Ambulatory Visit: Payer: Self-pay

## 2022-12-23 ENCOUNTER — Emergency Department (HOSPITAL_COMMUNITY)
Admission: EM | Admit: 2022-12-23 | Discharge: 2022-12-23 | Disposition: A | Discharge status: Home / Self Care | Payer: 59 | Attending: Emergency Medicine | Admitting: Emergency Medicine

## 2022-12-23 DIAGNOSIS — R531 Weakness: Secondary | ICD-10-CM | POA: Diagnosis not present

## 2022-12-23 DIAGNOSIS — Z20822 Contact with and (suspected) exposure to covid-19: Secondary | ICD-10-CM | POA: Insufficient documentation

## 2022-12-23 DIAGNOSIS — L0231 Cutaneous abscess of buttock: Secondary | ICD-10-CM | POA: Diagnosis present

## 2022-12-23 DIAGNOSIS — L0291 Cutaneous abscess, unspecified: Secondary | ICD-10-CM

## 2022-12-23 LAB — SARS CORONAVIRUS 2 BY RT PCR: SARS Coronavirus 2 by RT PCR: NEGATIVE

## 2022-12-23 MED ORDER — DOXYCYCLINE HYCLATE 100 MG PO CAPS: 100.0000 mg | ORAL_CAPSULE | Freq: Two times a day (BID) | ORAL | 0 refills | Status: AC

## 2022-12-23 NOTE — ED Triage Notes (Signed)
Pt reports weakness on legs x few days, also c/o abscess on lower back at "buttcrack"

## 2022-12-23 NOTE — Discharge Instructions (Signed)
Return if any problems.  Soak area 20 minutes 4 times a day °

## 2022-12-23 NOTE — ED Provider Notes (Signed)
[place] EMERGENCY DEPARTMENT AT [facility] Provider Note   CSN: [id number] Arrival date & time: 12/23/22  9629     History  Chief Complaint  Patient presents with   Weakness    Tricia Hughes is a 52 y.o. female.  Patient complains of weakness for several days.  Patient reports that she feels like she is constantly smelling mold.  Patient denies any fever or chills she has not had any cough or congestion patient denies any abdominal pain she has not had any urinary tract infection symptoms.  Patient denies exposure to anyone with COVID or flu.  Patient reports she is staying at the [facility] and has been having difficulty sleeping.  Patient also complains of a swollen area buttocks.  The history is provided by the patient. No language interpreter was used.  Weakness Severity:  Moderate Onset quality:  Gradual Timing:  Constant Chronicity:  New Relieved by:  Nothing Worsened by:  Nothing Ineffective treatments:  None tried Associated symptoms: no cough and no diarrhea   Risk factors: no anemia        Home Medications Prior to Admission medications   Medication Sig Start Date End Date Taking? Authorizing Provider  doxycycline (VIBRAMYCIN) 100 MG capsule Take 1 capsule (100 mg total) by mouth 2 (two) times daily. 12/23/22  Yes Elson Areas, PA-C      Allergies    Patient has no known allergies.    Review of Systems   Review of Systems  Respiratory:  Negative for cough.   Gastrointestinal:  Negative for diarrhea.  Neurological:  Positive for weakness.  All other systems reviewed and are negative.   Physical Exam Updated Vital Signs BP (!) 134/91 (BP Location: Left Arm)   Pulse 78   Temp 98.1 F (36.7 C) (Oral)   Resp 16   Ht 5\' 9"  (1.753 m)   Wt 88.9 kg   LMP 12/20/2022   SpO2 99%   BMI 28.94 kg/m  Physical Exam Vitals and nursing note reviewed.  Constitutional:      Appearance: She is well-developed.  HENT:     Head: Normocephalic.      Mouth/Throat:     Mouth: Mucous membranes are moist.  Eyes:     Extraocular Movements: Extraocular movements intact.     Pupils: Pupils are equal, round, and reactive to light.  Cardiovascular:     Rate and Rhythm: Normal rate.  Pulmonary:     Effort: Pulmonary effort is normal.  Abdominal:     General: There is no distension.  Musculoskeletal:        General: Normal range of motion.     Cervical back: Normal range of motion.  Skin:    Comments: 2 small swollen areas on low back 1 midline and 1 to the upper right buttock, no fluctuance.  Neurological:     General: No focal deficit present.     Mental Status: She is alert and oriented to person, place, and time.  Psychiatric:        Mood and Affect: Mood normal.     ED Results / Procedures / Treatments   Labs (all labs ordered are listed, but only abnormal results are displayed) Labs Reviewed  SARS CORONAVIRUS 2 BY RT PCR    EKG None  Radiology No results found.  Procedures Procedures    Medications Ordered in ED Medications - No data to display  ED Course/ Medical Decision Making/ A&P  Medical Decision Making Patient complains of feeling bad for several days.  Patient reports 2 swollen areas on her buttock patient thinks she may have abscesses  Amount and/or Complexity of Data Reviewed Labs: ordered. Decision-making details documented in ED Course.    Details: COVID ordered reviewed and interpreted COVID is negative  Risk Prescription drug management. Risk Details: Patient counseled on findings she is given a prescription for doxycycline.  Patient is advised to use warm wet compresses 20 minutes 4 times a day.  She is advised to return if any problems.           Final Clinical Impression(s) / ED Diagnoses Final diagnoses:  Abscess    Rx / DC Orders ED Discharge Orders          Ordered    doxycycline (VIBRAMYCIN) 100 MG capsule  2 times daily        12/23/22  1007           An After Visit Summary was printed and given to the patient.    Osie Cheeks 12/23/22 1204    Benjiman Core, MD 12/23/22 1527

## 2023-02-11 ENCOUNTER — Emergency Department (HOSPITAL_COMMUNITY)
Admission: EM | Admit: 2023-02-11 | Discharge: 2023-02-11 | Disposition: A | Discharge status: Home / Self Care | Payer: 59 | Attending: Emergency Medicine | Admitting: Emergency Medicine

## 2023-02-11 ENCOUNTER — Encounter (HOSPITAL_COMMUNITY): Payer: Self-pay | Admitting: Emergency Medicine

## 2023-02-11 ENCOUNTER — Other Ambulatory Visit: Payer: Self-pay

## 2023-02-11 DIAGNOSIS — H5789 Other specified disorders of eye and adnexa: Secondary | ICD-10-CM | POA: Diagnosis present

## 2023-02-11 DIAGNOSIS — K029 Dental caries, unspecified: Secondary | ICD-10-CM | POA: Insufficient documentation

## 2023-02-11 DIAGNOSIS — H1012 Acute atopic conjunctivitis, left eye: Secondary | ICD-10-CM | POA: Diagnosis not present

## 2023-02-11 MED ORDER — AMOXICILLIN-POT CLAVULANATE 875-125 MG PO TABS: 1.0000 | ORAL_TABLET | Freq: Two times a day (BID) | ORAL | 0 refills | Status: DC

## 2023-02-11 MED ORDER — OLOPATADINE HCL 0.1 % OP SOLN: 1.0000 [drp] | Freq: Two times a day (BID) | OPHTHALMIC | 1 refills | Status: DC

## 2023-02-11 MED ORDER — AMOXICILLIN-POT CLAVULANATE 875-125 MG PO TABS: 1.0000 | ORAL_TABLET | Freq: Two times a day (BID) | ORAL | 0 refills | Status: AC

## 2023-02-11 MED ORDER — OLOPATADINE HCL 0.1 % OP SOLN: 1.0000 [drp] | Freq: Two times a day (BID) | OPHTHALMIC | 1 refills | Status: AC

## 2023-02-11 NOTE — ED Provider Notes (Signed)
[place] EMERGENCY DEPARTMENT AT [facility] Provider Note   CSN: [id number] Arrival date & time: 02/11/23  4098     History  Chief Complaint  Patient presents with   Eye Problem    Tricia Hughes is a 52 y.o. female with past medical history which is overall noncontributory who presents with concern for some left eye itchiness/swelling, occasional feeling of irritation in the eye, as well as some upper dental pain on the left. Denies fever, chills, nausea, vomiting. Reports occasional bleeding with brushing teeth.   Eye Problem      Home Medications Prior to Admission medications   Medication Sig Start Date End Date Taking? Authorizing Provider  amoxicillin-clavulanate (AUGMENTIN) 875-125 MG tablet Take 1 tablet by mouth every 12 (twelve) hours. 02/11/23   Halee Glynn H, PA-C  doxycycline (VIBRAMYCIN) 100 MG capsule Take 1 capsule (100 mg total) by mouth 2 (two) times daily. 12/23/22   Elson Areas, PA-C  olopatadine (PATADAY) 0.1 % ophthalmic solution Place 1 drop into the left eye 2 (two) times daily. 02/11/23   Maleak Brazzel H, PA-C      Allergies    Patient has no known allergies.    Review of Systems   Review of Systems  HENT:  Positive for dental problem.   All other systems reviewed and are negative.   Physical Exam Updated Vital Signs BP (!) 154/97   Pulse 82   Temp 98 F (36.7 C) (Oral)   Resp 18   Ht 5\' 9"  (1.753 m)   Wt 88.5 kg   SpO2 100%   BMI 28.80 kg/m  Physical Exam Vitals and nursing note reviewed.  Constitutional:      General: She is not in acute distress.    Appearance: Normal appearance.  HENT:     Head: Normocephalic and atraumatic.     Mouth/Throat:     Comments: No significant posterior oropharynx erythema, swelling, exudate. Uvula midline, tonsils 1+ bilaterally.  No trismus, stridor, evidence of PTA, floor of mouth swelling or redness.   Eyes:     General:        Right eye: No discharge.         Left eye: No discharge.     Comments: Cobblestone mucosa noted on the left  Cardiovascular:     Rate and Rhythm: Normal rate and regular rhythm.  Pulmonary:     Effort: Pulmonary effort is normal. No respiratory distress.  Musculoskeletal:        General: No deformity.  Skin:    General: Skin is warm and dry.  Neurological:     Mental Status: She is alert and oriented to person, place, and time.  Psychiatric:        Mood and Affect: Mood normal.        Behavior: Behavior normal.     ED Results / Procedures / Treatments   Labs (all labs ordered are listed, but only abnormal results are displayed) Labs Reviewed - No data to display  EKG None  Radiology No results found.  Procedures Procedures    Medications Ordered in ED Medications - No data to display  ED Course/ Medical Decision Making/ A&P                                 Medical Decision Making   This an overall well-appearing 52 y.o.female who presents with concern for dental pain.  Physical exam reveals cavities, broken teeth in mouth on left..  Patient with redness, gum swelling without evidence of gum abscess, periapical abscess, PTA, uvula deviation, pharyngitis, epiglottitis, dysphonia, stridor.  Patient without difficulty swallowing.  No systemic fever, chills.  Given patient's symptoms think it is reasonable to treat with antibiotics.  Encouraged ibuprofen, Tylenol, Orajel, ice for pain control. Encouraged urgent dental follow-up, dental resource guide provided.  Provided Augmentin for antibiotic coverage.  Additionally she endorses some left eye irritation for months, findings and physical exam seem consistent with allergic conjunctivitis.  Will discharge with Pataday ophthalmic drops, encouraged ophthalmology follow-up as needed.  Patient discharged in stable condition at this time, return precautions given.  Final Clinical Impression(s) / ED Diagnoses Final diagnoses:  Allergic conjunctivitis of left  eye  Pain due to dental caries    Rx / DC Orders ED Discharge Orders          Ordered    amoxicillin-clavulanate (AUGMENTIN) 875-125 MG tablet  Every 12 hours,   Status:  Discontinued        02/11/23 0848    olopatadine (PATADAY) 0.1 % ophthalmic solution  2 times daily,   Status:  Discontinued        02/11/23 0848    amoxicillin-clavulanate (AUGMENTIN) 875-125 MG tablet  Every 12 hours        02/11/23 0857    olopatadine (PATADAY) 0.1 % ophthalmic solution  2 times daily        02/11/23 0857              Naijah Lacek, Harrel Carina, PA-C 02/11/23 0901    Linwood Dibbles, MD 02/11/23 1827

## 2023-02-11 NOTE — Discharge Instructions (Signed)
Please use Tylenol or ibuprofen for pain.  You may use 600 mg ibuprofen every 6 hours or 1000 mg of Tylenol every 6 hours.  You may choose to alternate between the 2.  This would be most effective.  Not to exceed 4 g of Tylenol within 24 hours.  Not to exceed 3200 mg ibuprofen 24 hours.  Please take the entire course of antibiotics. Follow up if no improvement in symptoms.

## 2023-02-11 NOTE — ED Triage Notes (Signed)
Patient arrives ambulatory by POV c/o left eye itchiness ongoing a few months. Reports intermittent ear aches and upper dental pain.

## 2023-02-20 ENCOUNTER — Emergency Department (HOSPITAL_COMMUNITY): Payer: 59

## 2023-02-20 ENCOUNTER — Other Ambulatory Visit: Payer: Self-pay

## 2023-02-20 ENCOUNTER — Encounter (HOSPITAL_COMMUNITY): Payer: Self-pay

## 2023-02-20 ENCOUNTER — Emergency Department (HOSPITAL_COMMUNITY)
Admission: EM | Admit: 2023-02-20 | Discharge: 2023-02-20 | Discharge status: Against Medical Advice | Payer: 59 | Attending: Emergency Medicine | Admitting: Emergency Medicine

## 2023-02-20 DIAGNOSIS — G589 Mononeuropathy, unspecified: Secondary | ICD-10-CM | POA: Insufficient documentation

## 2023-02-20 DIAGNOSIS — G5632 Lesion of radial nerve, left upper limb: Secondary | ICD-10-CM

## 2023-02-20 DIAGNOSIS — R200 Anesthesia of skin: Secondary | ICD-10-CM | POA: Diagnosis present

## 2023-02-20 LAB — CBC WITH DIFFERENTIAL/PLATELET
Abs Immature Granulocytes: 0.01 10*3/uL (ref 0.00–0.07)
Basophils Absolute: 0 10*3/uL (ref 0.0–0.1)
Basophils Relative: 1 %
Eosinophils Absolute: 0 10*3/uL (ref 0.0–0.5)
Eosinophils Relative: 1 %
HCT: 41.4 % (ref 36.0–46.0)
Hemoglobin: 13.7 g/dL (ref 12.0–15.0)
Immature Granulocytes: 0 %
Lymphocytes Relative: 47 %
Lymphs Abs: 1.9 10*3/uL (ref 0.7–4.0)
MCH: 27.9 pg (ref 26.0–34.0)
MCHC: 33.1 g/dL (ref 30.0–36.0)
MCV: 84.3 fL (ref 80.0–100.0)
Monocytes Absolute: 0.5 10*3/uL (ref 0.1–1.0)
Monocytes Relative: 11 %
Neutro Abs: 1.6 10*3/uL — ABNORMAL LOW (ref 1.7–7.7)
Neutrophils Relative %: 40 %
Platelets: 266 10*3/uL (ref 150–400)
RBC: 4.91 MIL/uL (ref 3.87–5.11)
RDW: 12.6 % (ref 11.5–15.5)
WBC: 4 10*3/uL (ref 4.0–10.5)
nRBC: 0 % (ref 0.0–0.2)

## 2023-02-20 LAB — BASIC METABOLIC PANEL
Anion gap: 11 (ref 5–15)
BUN: 22 mg/dL — ABNORMAL HIGH (ref 6–20)
CO2: 23 mmol/L (ref 22–32)
Calcium: 9.9 mg/dL (ref 8.9–10.3)
Chloride: 103 mmol/L (ref 98–111)
Creatinine, Ser: 0.99 mg/dL (ref 0.44–1.00)
GFR, Estimated: >60 mL/min (ref >60)
Glucose, Bld: 124 mg/dL — ABNORMAL HIGH (ref 70–99)
Potassium: 3.7 mmol/L (ref 3.5–5.1)
Sodium: 137 mmol/L (ref 135–145)

## 2023-02-20 NOTE — ED Provider Triage Note (Signed)
Emergency Medicine Provider Triage Evaluation Note  Tricia Hughes , a 52 y.o. female  was evaluated in triage.  Pt complains of R arm limpness and tingling since 630AM On 11/1. Denies changes in speech, confusion, weakness of R shoulder, facial droop.  Review of Systems  Positive: R arm numbness, tingling Negative: Facial droop  Physical Exam  BP 129/77   Pulse 68   Temp 97.9 F (36.6 C)   Resp 18   Ht 5\' 9"  (1.753 m)   Wt 80.7 kg   LMP 01/22/2023   SpO2 98%   BMI 26.29 kg/m  Gen:   Awake, no distress   Resp:  Normal effort  MSK:   Moves extremities without difficulty  Other:  +flaccidity of R forearm, good strength of R humerus and shoulder. +TTP of R elbow, no drift noted. No facial droop.  Medical Decision Making  Medically screening exam initiated at 1:49 AM.  Appropriate orders placed.  Tricia Hughes was informed that the remainder of the evaluation will be completed by another provider, this initial triage assessment does not replace that evaluation, and the importance of remaining in the ED until their evaluation is complete.     Tricia Hughes, [place] 02/20/23 [phone]

## 2023-02-20 NOTE — ED Provider Notes (Signed)
[place] EMERGENCY DEPARTMENT AT [facility] Provider Note  CSN: [id number] Arrival date & time: 02/20/23 0011  Chief Complaint(s) Numbness  HPI Tricia Hughes is a 52 y.o. female here for left upper extremity numbness and weakness.  Patient noted this around 6:30 AM yesterday morning after waking from sleep.  She believes she slept on it wrong.  Reports that the function is improving but still feels numb.  She denies any falls or trauma.  No visual disturbance.  No other focal deficits.  HPI  Past Medical History History reviewed. No pertinent past medical history. Patient Active Problem List   Diagnosis Date Noted   Perimenopause 08/18/2022   Homelessness 08/18/2022   Home Medication(s) Prior to Admission medications   Medication Sig Start Date End Date Taking? Authorizing Provider  amoxicillin-clavulanate (AUGMENTIN) 875-125 MG tablet Take 1 tablet by mouth every 12 (twelve) hours. 02/11/23   Prosperi, Christian H, PA-C  doxycycline (VIBRAMYCIN) 100 MG capsule Take 1 capsule (100 mg total) by mouth 2 (two) times daily. 12/23/22   Elson Areas, PA-C  olopatadine (PATADAY) 0.1 % ophthalmic solution Place 1 drop into the left eye 2 (two) times daily. 02/11/23   Prosperi, Christian H, PA-C                                                                                                                                    Allergies Patient has no known allergies.  Review of Systems Review of Systems As noted in HPI  Physical Exam Vital Signs  I have reviewed the triage vital signs BP 129/81   Pulse 73   Temp (!) 97.4 F (36.3 C)   Resp 17   Ht 5\' 9"  (1.753 m)   Wt 80.7 kg   LMP 01/22/2023   SpO2 100%   BMI 26.29 kg/m   Physical Exam Vitals reviewed.  Constitutional:      General: She is not in acute distress.    Appearance: She is well-developed. She is not diaphoretic.  HENT:     Head: Normocephalic and atraumatic.     Right Ear: External ear  normal.     Left Ear: External ear normal.     Nose: Nose normal.  Eyes:     General: No scleral icterus.    Conjunctiva/sclera: Conjunctivae normal.  Neck:     Trachea: Phonation normal.  Cardiovascular:     Rate and Rhythm: Normal rate and regular rhythm.  Pulmonary:     Effort: Pulmonary effort is normal. No respiratory distress.     Breath sounds: No stridor.  Abdominal:     General: There is no distension.  Musculoskeletal:        General: Normal range of motion.     Cervical back: Normal range of motion.  Neurological:     Mental Status: She is alert and oriented to person, place, and time.     Comments:  Mental Status:  Alert and oriented to person, place, and time.  Attention and concentration normal.  Speech clear.  Recent memory is intact  Cranial Nerves:  II Visual Fields: Intact to confrontation. Visual fields intact. III, IV, VI: Pupils equal and reactive to light and near. Full eye movement without nystagmus  V Facial Sensation: Normal. No weakness of masticatory muscles  VII: No facial weakness or asymmetry  VIII Auditory Acuity: Grossly normal  IX/X: The uvula is midline; the palate elevates symmetrically  XI: Normal sternocleidomastoid and trapezius strength  XII: The tongue is midline. No atrophy or fasciculations.   Motor System: Muscle Strength: Patient has weakness with extension of the left wrist, finger opposition, thumb index finger opposition, thumb extension. Otherwise 5/5 and symmetric in the upper and lower extremities. No pronation or drift.  Muscle Tone: Tone and muscle bulk are normal in the upper and lower extremities.  Reflexes: DTRs: 1+ and symmetrical in all four extremities. No Clonus Coordination: Intact finger-to-nose. No tremor.  Sensation: Decrease sensation below the elbow of the left arm. Gait: Routine  gait normal.   Psychiatric:        Behavior: Behavior normal.      ED Results and Treatments Labs (all labs ordered are  listed, but only abnormal results are displayed) Labs Reviewed  CBC WITH DIFFERENTIAL/PLATELET - Abnormal; Notable for the following components:      Result Value   Neutro Abs 1.6 (*)    All other components within normal limits  BASIC METABOLIC PANEL - Abnormal; Notable for the following components:   Glucose, Bld 124 (*)    BUN 22 (*)    All other components within normal limits                                                                                                                         EKG  EKG Interpretation Date/Time:    Ventricular Rate:    PR Interval:    QRS Duration:    QT Interval:    QTC Calculation:   R Axis:      Text Interpretation:         Radiology MR BRAIN WO CONTRAST  Result Date: 02/20/2023 CLINICAL DATA:  Initial evaluation for neuro deficit, stroke suspected, left-sided weakness. EXAM: MRI HEAD WITHOUT CONTRAST TECHNIQUE: Multiplanar, multiecho pulse sequences of the brain and surrounding structures were obtained without intravenous contrast. COMPARISON:  None Available. FINDINGS: Brain: Cerebral volume within normal limits. Few scattered subcentimeter foci of T2/FLAIR hyperintensity seen involving the supratentorial cerebral white matter, most like related chronic microvascular ischemic disease. Overall, appearance is extremely mild for age. No evidence for acute or subacute ischemia. Gray-white matter differentiation maintained. No areas of chronic cortical infarction. No acute or chronic intracranial blood products. No mass lesion, midline shift or mass effect. No hydrocephalus or extra-axial fluid collection. Pituitary gland and suprasellar region within normal limits. Vascular: Major intracranial vascular flow voids are maintained. Skull and upper cervical spine: Craniocervical  junction within normal limits. Bone marrow signal intensity overall within normal limits. No scalp soft tissue abnormality. Sinuses/Orbits: Globes and orbital soft tissues  demonstrate no acute finding. Remote posttraumatic defect noted at the left lamina papyracea. Paranasal sinuses are largely clear. No mastoid effusion. Other: None. IMPRESSION: 1. No acute intracranial abnormality. 2. 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 Electronically Signed   By: Rise Mu M.D.   On: 02/20/2023 03:22    Medications Ordered in ED Medications - No data to display Procedures Procedures  (including critical care time) Medical Decision Making / ED Course   Medical Decision Making Amount and/or Complexity of Data Reviewed Labs: ordered. Decision-making details documented in ED Course. Radiology: ordered and independent interpretation performed. Decision-making details documented in ED Course.    Left arm numbness and weakness.  Favor peripheral nerve palsy.  Given the multiple nerves involved.  MRI obtained to rule out acute stroke. CBC without leukocytosis or anemia. BMP without significant electrolyte derangements or renal sufficiency. MRI negative for stroke. Patient eloped prior to results. HIPPA complaint message was left on her voicemail to follow-up with PCP orthopedic surgery if symptoms do not resolved.  Instructed to use wrist brace for support.    Final Clinical Impression(s) / ED Diagnoses Final diagnoses:  Nerve palsy, Saturday night, left    This chart was dictated using voice recognition software.  Despite best efforts to proofread,  errors can occur which can change the documentation meaning.    Nira Conn, MD 02/20/23 [id number]

## 2023-02-20 NOTE — ED Triage Notes (Addendum)
Patient reports numbness in the left wrist at 0600am on 02/19/2023. Patient states she has pain in the left shoulder and left wrist feels "weird". Reports shoulder pain a 7/10. Grips are unequal and stronger in the right hand. No changes in speech. Patient is able to ambulate without difficulty. Patient reports a headache around 2300 yesterday. No headache during triage.

## 2023-02-20 NOTE — ED Notes (Signed)
TO MRI

## 2023-03-03 ENCOUNTER — Encounter: Payer: Self-pay | Admitting: *Deleted

## 2023-03-03 NOTE — Progress Notes (Signed)
Pt attended 08/18/22 screening event where here b/p was 118/82 and her blood sugar was 105. At the event the pt noted she was homeless and documented mailing address as [place], had no PCP and no insurance. During the initial f/u, pt was given SDOH handouts and the health equity team member made a PCP appt for pt on 10/14/22,which, per chart review, she did not keep. During the 60 day event f/u, pt stated she had formerly been denied for Medicaid and was f/u with the [employer] and health equity team member made [facility] referral for housing. During 6 month event f/u today, pt stated she had applied for "insurance and they game me "oscar" insurance and told me my PCP was Dr. Louanne Skye but I wanted Medicaid and a dentist and eye exam and medicines and PCP I want to choose." Health equity team member today gave pt contact info for a  free Insurance Legal Aid+ [place] navigator event tomorrow where she could explore any other alternatives to insurance for which she might qualify. Pt verbalized intention to go to this event for first-hand navigation support and intention to then establish care with a PCP who could then connect her to additional healthcare services she wanted. Chart review indicates pt last seen in ED on 02/20/2023 and chart info lists current address at an apt., so final letter mailed to pt at that address with [facility] and [facility] contact info and future insurance events scheduled for this month in case she does not attend tomorrow. No additional health equity team support scheduled at this time.

## 2023-08-04 ENCOUNTER — Telehealth: Payer: Self-pay

## 2023-08-04 NOTE — Telephone Encounter (Signed)
 Patient called for recent TB test results. This RN does not see that TB testing was recently performed. RN LVM for pt with a callback number.  Copied from CRM [id number]. Topic: Clinical - Lab/Test Results >> Aug 04, 2023 10:15 AM Tricia Hughes S wrote: Reason for CRM: Patient called to get results of recent TB tests. Please send through Mychart

## 2023-09-12 NOTE — Patient Instructions (Incomplete)

## 2023-09-12 NOTE — Progress Notes (Deleted)
 New Patient Office Visit  Subjective    Patient ID: Chantil Diane Banas, female    DOB: [birth year]  Age: 53 y.o. MRN: [id number]  CC: No chief complaint on file.   HPI Lubertha Paulene Tayag presents to establish care today. Up to date on routine vaccines. Up to date on routine screenings.  Receives regular dental and eye care.  Reports eating well, sleeping well, feeling well overall.  Reports compliance with medication regimen.  Denies other concerns today.  Outpatient Encounter Medications as of 09/14/2023  Medication Sig  . amoxicillin -clavulanate (AUGMENTIN ) 875-125 MG tablet Take 1 tablet by mouth every 12 (twelve) hours.  . doxycycline  (VIBRAMYCIN ) 100 MG capsule Take 1 capsule (100 mg total) by mouth 2 (two) times daily.  . olopatadine  (PATADAY ) 0.1 % ophthalmic solution Place 1 drop into the left eye 2 (two) times daily.   No facility-administered encounter medications on file as of 09/14/2023.    No past medical history on file.  Past Surgical History:  Procedure Laterality Date  . TUBAL LIGATION      No family history on file.  Social History   Socioeconomic History  . Marital status: Single    Spouse name: Not on file  . Number of children: Not on file  . Years of education: Not on file  . Highest education level: Not on file  Occupational History  . Not on file  Tobacco Use  . Smoking status: Former  . Smokeless tobacco: Never  Vaping Use  . Vaping status: Never Used  Substance and Sexual Activity  . Alcohol use: Yes  . Drug use: No  . Sexual activity: Not on file  Other Topics Concern  . Not on file  Social History Narrative  . Not on file   Social Drivers of Health   Financial Resource Strain: Not on file  Food Insecurity: No Food Insecurity (08/18/2022)   Hunger Vital Sign   . Worried About Programme researcher, broadcasting/film/video in the Last Year: Never true   . Ran Out of Food in the Last Year: Never true  Transportation Needs: No Transportation Needs  (08/18/2022)   PRAPARE - Transportation   . Lack of Transportation (Medical): No   . Lack of Transportation (Non-Medical): No  Physical Activity: Not on file  Stress: Not on file  Social Connections: Not on file  Intimate Partner Violence: Not At Risk (06/11/2023)   Received from [facility]   HITS   . Over the last 12 months how often did your partner physically hurt you?: Never   . Over the last 12 months how often did your partner insult you or talk down to you?: Never   . Over the last 12 months how often did your partner threaten you with physical harm?: Never   . Over the last 12 months how often did your partner scream or curse at you?: Never    ROS Per HPI      Objective    There were no vitals taken for this visit.  Physical Exam Vitals and nursing note reviewed.  Constitutional:      General: She is not in acute distress.    Appearance: Normal appearance. She is normal weight.  HENT:     Head: Normocephalic and atraumatic.     Right Ear: External ear normal.     Left Ear: External ear normal.     Nose: Nose normal.     Mouth/Throat:     Mouth: Mucous membranes are  moist.     Pharynx: Oropharynx is clear.  Eyes:     Extraocular Movements: Extraocular movements intact.     Pupils: Pupils are equal, round, and reactive to light.  Cardiovascular:     Rate and Rhythm: Normal rate and regular rhythm.     Pulses: Normal pulses.     Heart sounds: Normal heart sounds.  Pulmonary:     Effort: Pulmonary effort is normal. No respiratory distress.     Breath sounds: Normal breath sounds. No wheezing, rhonchi or rales.  Musculoskeletal:        General: Normal range of motion.     Cervical back: Normal range of motion.     Right lower leg: No edema.     Left lower leg: No edema.  Lymphadenopathy:     Cervical: No cervical adenopathy.  Neurological:     General: No focal deficit present.     Mental Status: She is alert and oriented to person, place, and time.   Psychiatric:        Mood and Affect: Mood normal.        Thought Content: Thought content normal.       Assessment & Plan:   Perimenopause  Homelessness     No follow-ups on file.   Wellington Half, FNP

## 2023-09-14 ENCOUNTER — Encounter: Admitting: Family Medicine

## 2023-09-14 DIAGNOSIS — N951 Menopausal and female climacteric states: Secondary | ICD-10-CM

## 2023-09-14 DIAGNOSIS — Z5900 Homelessness unspecified: Secondary | ICD-10-CM

## 2023-09-14 DIAGNOSIS — Z136 Encounter for screening for cardiovascular disorders: Secondary | ICD-10-CM

## 2023-10-04 IMAGING — DX DG CHEST 1V PORT
2 series · 2 of 2 positions shown · non-contrast
Comparison: 04/06/2013

CLINICAL DATA: Mid chest pain, shortness of breath and weakness.

EXAM:
PORTABLE CHEST 1 VIEW

[chest ap (1 of 2)]
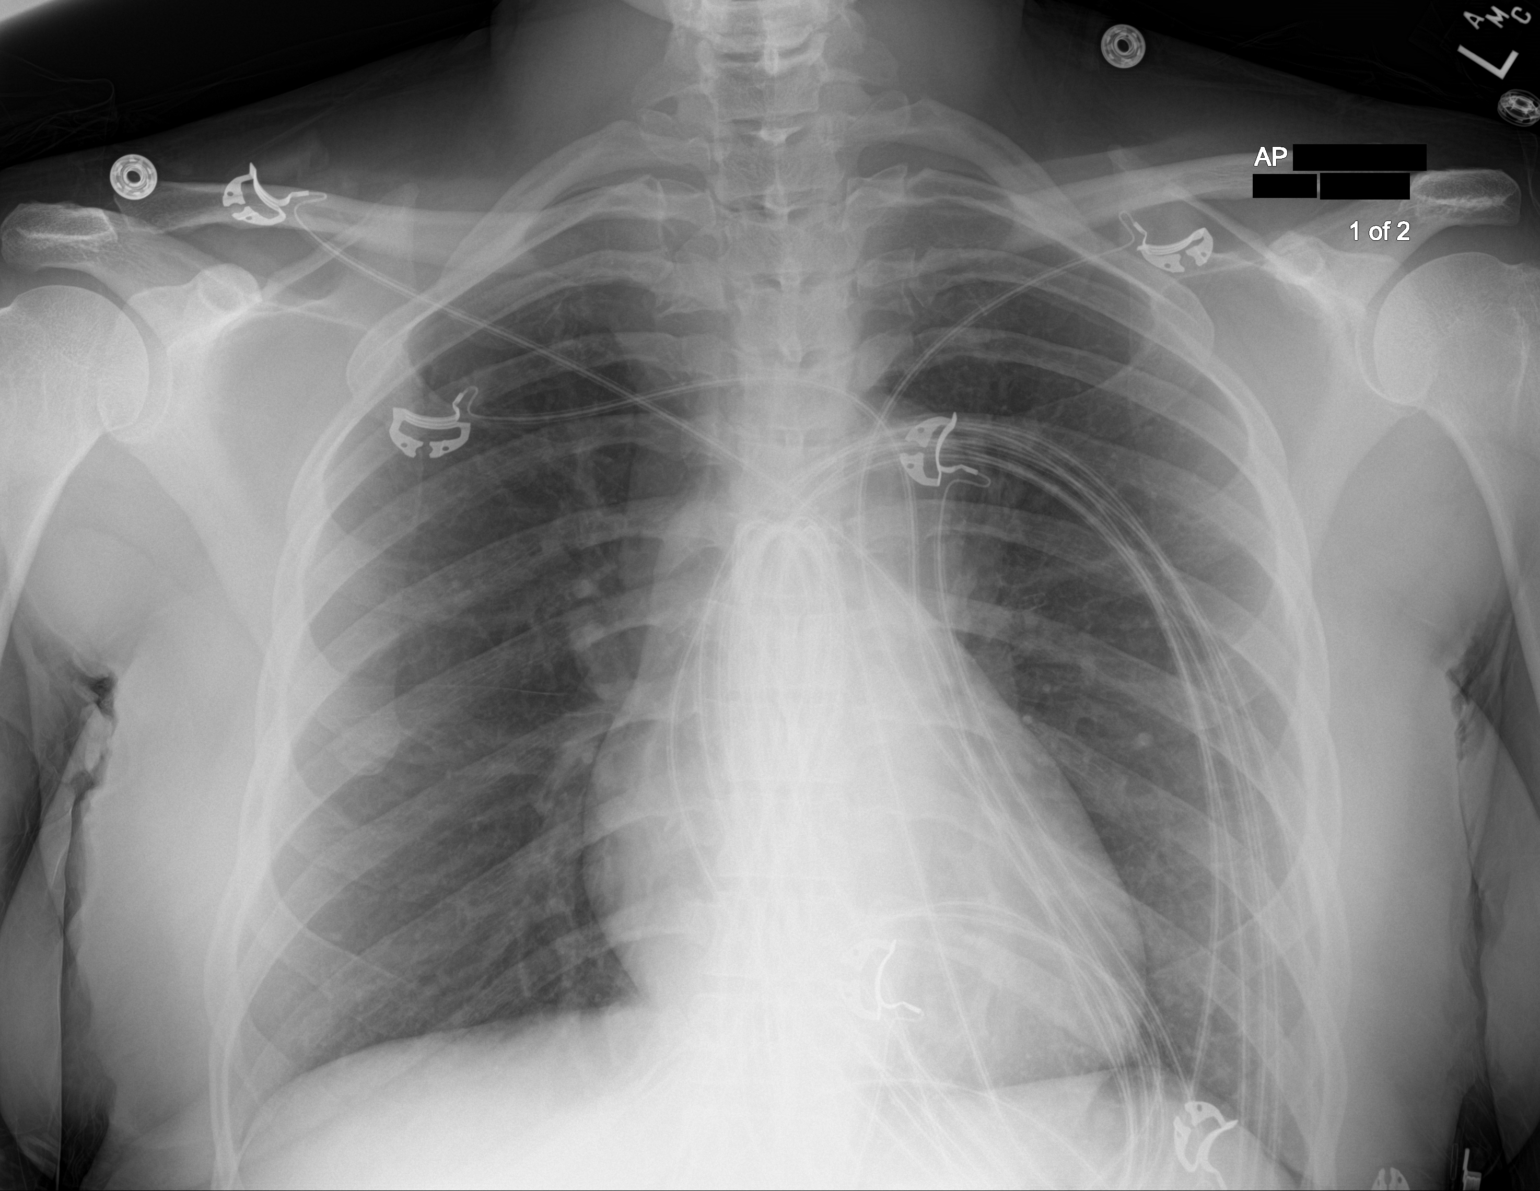

[chest ap (2 of 2)]
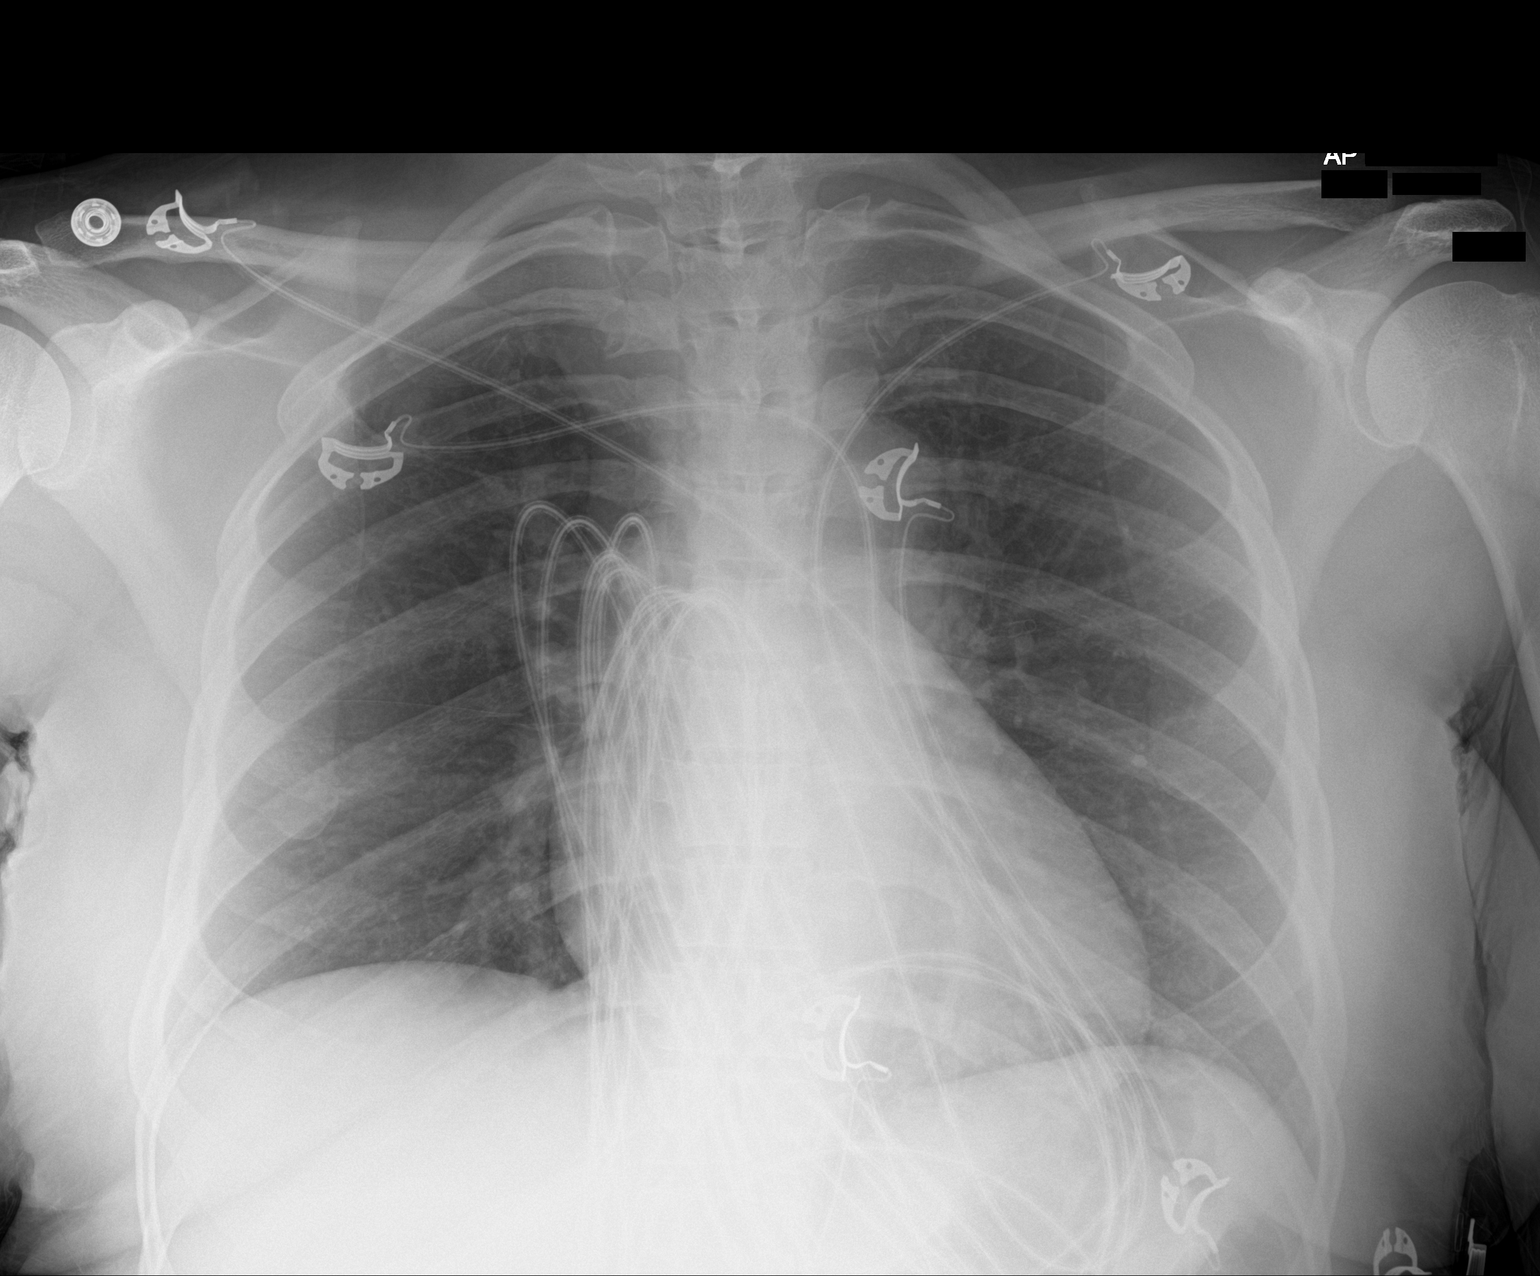

[2 of 2 positions shown; findings below may reference images not displayed]

FINDINGS: Heart size and mediastinal contours appear normal. No pleural
effusion or interstitial edema. No airspace opacities identified.
Visualized osseous structures appear intact.
IMPRESSION: No active disease.

## 2024-02-05 ENCOUNTER — Other Ambulatory Visit: Payer: Self-pay

## 2024-02-05 ENCOUNTER — Emergency Department (HOSPITAL_COMMUNITY)
Admission: EM | Admit: 2024-02-05 | Discharge: 2024-02-06 | Disposition: A | Discharge status: Home / Self Care | Payer: Self-pay | Attending: Emergency Medicine | Admitting: Emergency Medicine

## 2024-02-05 ENCOUNTER — Encounter (HOSPITAL_COMMUNITY): Payer: Self-pay

## 2024-02-05 DIAGNOSIS — M546 Pain in thoracic spine: Secondary | ICD-10-CM | POA: Insufficient documentation

## 2024-02-05 DIAGNOSIS — M436 Torticollis: Secondary | ICD-10-CM | POA: Insufficient documentation

## 2024-02-05 MED ORDER — LIDOCAINE 5 % EX PTCH
1.0000 | MEDICATED_PATCH | CUTANEOUS | Status: DC
  Administered 2024-02-06: 1 via TRANSDERMAL
  Filled 2024-02-05: qty 1

## 2024-02-05 MED ORDER — HYDROCODONE-ACETAMINOPHEN 5-325 MG PO TABS
1.0000 | ORAL_TABLET | Freq: Once | ORAL | Status: AC
  Administered 2024-02-06: 1 via ORAL
  Filled 2024-02-05: qty 1

## 2024-02-05 NOTE — ED Provider Notes (Signed)
 [place] EMERGENCY DEPARTMENT AT [facility] Provider Note   CSN: [id number] Arrival date & time: 02/05/24  2118     Patient presents with: Torticollis   Tricia Hughes is a 53 y.o. female.  {Add pertinent medical, surgical, social history, OB history to YEP:67052} Patient denies any medical history.  She works as a Lawyer.  She has right-sided neck pain and upper back pain that radiates down her right arm for the past 3 weeks.  Does not recall any specific injury but believes she may have injured herself trying to lift the patient.  Pain starts at her right neck and radiates to her right upper back and right arm.  Associate with some numbness and tingling to her fingertips but no weakness.  No chest pain or shortness of breath.  No cough or fever.  She is taking ibuprofen  without relief.  Comes in today because symptoms are worsening.  Denies weakness in her arm.  Denies chest pain or shortness of breath.  Denies headache.  Denies visual changes.  No abdominal pain, nausea or vomiting.  No fever.  Reports the pain is persistent mostly to the right paraspinal musculature of her neck that radiates down her right arm and she is having tingling in her fingertips but no weakness.  Sometimes the pain radiates to her right scapula and upper back.  The history is provided by the patient.       Prior to Admission medications   Medication Sig Start Date End Date Taking? Authorizing Provider  amoxicillin -clavulanate (AUGMENTIN ) 875-125 MG tablet Take 1 tablet by mouth every 12 (twelve) hours. 02/11/23   Prosperi, Christian H, PA-C  doxycycline  (VIBRAMYCIN ) 100 MG capsule Take 1 capsule (100 mg total) by mouth 2 (two) times daily. 12/23/22   Sofia, Leslie K, PA-C  olopatadine  (PATADAY ) 0.1 % ophthalmic solution Place 1 drop into the left eye 2 (two) times daily. 02/11/23   Prosperi, Christian H, PA-C    Allergies: Patient has no known allergies.    Review of Systems   Constitutional:  Negative for activity change, appetite change and fever.  HENT:  Negative for congestion and rhinorrhea.   Respiratory:  Negative for cough, chest tightness and shortness of breath.   Cardiovascular:  Negative for chest pain and leg swelling.  Gastrointestinal:  Negative for vomiting.  Genitourinary:  Negative for dysuria.  Musculoskeletal:  Positive for arthralgias, myalgias and neck pain.  Skin:  Negative for rash.  Neurological:  Negative for dizziness, weakness, light-headedness and headaches.   all other systems are negative except as noted in the HPI and PMH.    Updated Vital Signs BP (!) 136/99   Pulse 97   Temp 98.2 F (36.8 C) (Oral)   Resp 17   SpO2 99%   Physical Exam Vitals and nursing note reviewed.  Constitutional:      General: She is not in acute distress.    Appearance: She is well-developed.  HENT:     Head: Normocephalic and atraumatic.     Mouth/Throat:     Pharynx: No oropharyngeal exudate.  Eyes:     Conjunctiva/sclera: Conjunctivae normal.     Pupils: Pupils are equal, round, and reactive to light.  Neck:     Comments: Right paraspinal cervical tenderness Cardiovascular:     Rate and Rhythm: Normal rate and regular rhythm.     Heart sounds: Normal heart sounds. No murmur heard. Pulmonary:     Effort: Pulmonary effort is normal. No respiratory distress.  Breath sounds: Normal breath sounds.  Abdominal:     Palpations: Abdomen is soft.     Tenderness: There is no abdominal tenderness. There is no guarding or rebound.  Musculoskeletal:        General: Tenderness present.     Cervical back: Normal range of motion and neck supple.     Comments: R paraspinal cervical tenderness. And paraspinal thoracic tenderness. No midline C or T spine tenderness.  Equal grip strength bilaterally.  Equal radial pulses  Skin:    General: Skin is warm.  Neurological:     Mental Status: She is alert and oriented to person, place, and time.      Cranial Nerves: No cranial nerve deficit.     Motor: No abnormal muscle tone.     Coordination: Coordination normal.     Comments:  5/5 strength throughout. CN 2-12 intact.Equal grip strength.   Psychiatric:        Behavior: Behavior normal.     (all labs ordered are listed, but only abnormal results are displayed) Labs Reviewed  CBC WITH DIFFERENTIAL/PLATELET  BASIC METABOLIC PANEL WITH GFR  LIPASE, BLOOD  D-DIMER, QUANTITATIVE  I-STAT CHEM 8, ED  TROPONIN T, HIGH SENSITIVITY    EKG: None  Radiology: No results found.  {Document cardiac monitor, telemetry assessment procedure when appropriate:32947} Procedures   Medications Ordered in the ED  lidocaine (LIDODERM) 5 % 1 patch (has no administration in time range)  HYDROcodone -acetaminophen  (NORCO/VICODIN) 5-325 MG per tablet 1 tablet (has no administration in time range)      {Click here for ABCD2, HEART and other calculators REFRESH Note before signing:1}                              Medical Decision Making Amount and/or Complexity of Data Reviewed Labs: ordered. Decision-making details documented in ED Course. Radiology: ordered and independent interpretation performed. Decision-making details documented in ED Course. ECG/medicine tests: ordered and independent interpretation performed. Decision-making details documented in ED Course.  Risk Prescription drug management.   3 weeks of right paraspinal cervical spine arm.  No weakness.  Intact radial pulse.   suspect cervical radiculopathy.  She denies any weakness, numbness.  No bowel or bladder incontinence.  No fever or vomiting.  Low concern for cord compression or cauda equina.  {Document critical care time when appropriate  Document review of labs and clinical decision tools ie CHADS2VASC2, etc  Document your independent review of radiology images and any outside records  Document your discussion with family members, caretakers and with consultants  Document  social determinants of health affecting pt's care  Document your decision making why or why not admission, treatments were needed:32947:::1}   Final diagnoses:  None    ED Discharge Orders     None

## 2024-02-05 NOTE — ED Triage Notes (Signed)
 Pt presents via POV c/o right sided neck pain x2 weeks. Denies specific injury.

## 2024-02-06 ENCOUNTER — Emergency Department (HOSPITAL_COMMUNITY): Payer: Self-pay

## 2024-02-06 LAB — I-STAT CHEM 8, ED
BUN: 12 mg/dL (ref 6–20)
Calcium, Ion: 1.14 mmol/L — ABNORMAL LOW (ref 1.15–1.40)
Chloride: 104 mmol/L (ref 98–111)
Creatinine, Ser: 0.8 mg/dL (ref 0.44–1.00)
Glucose, Bld: 96 mg/dL (ref 70–99)
HCT: 40 % (ref 36.0–46.0)
Hemoglobin: 13.6 g/dL (ref 12.0–15.0)
Potassium: 3.4 mmol/L — ABNORMAL LOW (ref 3.5–5.1)
Sodium: 143 mmol/L (ref 135–145)
TCO2: 23 mmol/L (ref 22–32)

## 2024-02-06 LAB — CBC WITH DIFFERENTIAL/PLATELET
Abs Immature Granulocytes: 0 K/uL (ref 0.00–0.07)
Basophils Absolute: 0 K/uL (ref 0.0–0.1)
Basophils Relative: 1 %
Eosinophils Absolute: 0 K/uL (ref 0.0–0.5)
Eosinophils Relative: 1 %
HCT: 43.5 % (ref 36.0–46.0)
Hemoglobin: 14.1 g/dL (ref 12.0–15.0)
Immature Granulocytes: 0 %
Lymphocytes Relative: 50 %
Lymphs Abs: 1.9 K/uL (ref 0.7–4.0)
MCH: 27.3 pg (ref 26.0–34.0)
MCHC: 32.4 g/dL (ref 30.0–36.0)
MCV: 84.1 fL (ref 80.0–100.0)
Monocytes Absolute: 0.5 K/uL (ref 0.1–1.0)
Monocytes Relative: 14 %
Neutro Abs: 1.3 K/uL — ABNORMAL LOW (ref 1.7–7.7)
Neutrophils Relative %: 34 %
Platelets: 231 K/uL (ref 150–400)
RBC: 5.17 MIL/uL — ABNORMAL HIGH (ref 3.87–5.11)
RDW: 13.2 % (ref 11.5–15.5)
WBC: 3.7 K/uL — ABNORMAL LOW (ref 4.0–10.5)
nRBC: 0 % (ref 0.0–0.2)

## 2024-02-06 LAB — D-DIMER, QUANTITATIVE: D-Dimer, Quant: <0.27 ug{FEU}/mL (ref 0.00–0.50)

## 2024-02-06 LAB — TROPONIN T, HIGH SENSITIVITY: Troponin T High Sensitivity: <15 ng/L (ref 0–19)

## 2024-02-06 LAB — LIPASE, BLOOD: Lipase: 26 U/L (ref 11–51)

## 2024-02-06 MED ORDER — METHYLPREDNISOLONE 4 MG PO TBPK: ORAL_TABLET | ORAL | 0 refills | Status: AC

## 2024-02-06 MED ORDER — METHOCARBAMOL 500 MG PO TABS: 500.0000 mg | ORAL_TABLET | Freq: Three times a day (TID) | ORAL | 0 refills | Status: AC | PRN

## 2024-02-06 MED ORDER — IOHEXOL 350 MG/ML SOLN
75.0000 mL | Freq: Once | INTRAVENOUS | Status: AC | PRN
  Administered 2024-02-06: 75 mL via INTRAVENOUS

## 2024-02-06 MED ORDER — LIDOCAINE 5 % EX PTCH: 1.0000 | MEDICATED_PATCH | CUTANEOUS | 0 refills | Status: AC

## 2024-02-06 NOTE — Discharge Instructions (Signed)
 Take the medications as prescribed.  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  They should call you to evaluate this further.  Return to the ED with sudden onset headache, unilateral weakness, numbness, tingling, difficulty speaking, difficulty swallowing or other concerns.

## 2024-02-06 NOTE — ED Notes (Signed)
 Patient transported to CT

## 2024-02-07 ENCOUNTER — Telehealth: Payer: Self-pay | Admitting: Neuroradiology

## 2024-02-07 NOTE — Telephone Encounter (Signed)
 Attempted to contact patient to schedule with Dr Lester per Dr Janjua. No answer/Left message

## 2024-02-21 ENCOUNTER — Other Ambulatory Visit: Payer: Self-pay

## 2024-02-21 ENCOUNTER — Inpatient Hospital Stay
Admission: RE | Admit: 2024-02-21 | Discharge: 2024-02-21 | Disposition: A | Discharge status: Home / Self Care | Payer: Self-pay | Source: Ambulatory Visit | Attending: Neurosurgery | Admitting: Neurosurgery

## 2024-02-21 DIAGNOSIS — Z049 Encounter for examination and observation for unspecified reason: Secondary | ICD-10-CM

## 2024-02-24 ENCOUNTER — Ambulatory Visit: Payer: Self-pay | Admitting: Neuroradiology
